# Patient Record
Sex: Male | Born: 1945 | Race: White | Hispanic: No | Marital: Married | Smoking: Never smoker
Health system: Southern US, Community
[De-identification: ages and names within clinical notes are randomized; demographics above are authoritative.]

## PROBLEM LIST (undated history)

## (undated) DIAGNOSIS — K760 Fatty (change of) liver, not elsewhere classified: Secondary | ICD-10-CM

## (undated) DIAGNOSIS — Z87442 Personal history of urinary calculi: Secondary | ICD-10-CM

## (undated) DIAGNOSIS — I10 Essential (primary) hypertension: Secondary | ICD-10-CM

## (undated) DIAGNOSIS — E785 Hyperlipidemia, unspecified: Secondary | ICD-10-CM

## (undated) DIAGNOSIS — N529 Male erectile dysfunction, unspecified: Secondary | ICD-10-CM

## (undated) DIAGNOSIS — D126 Benign neoplasm of colon, unspecified: Secondary | ICD-10-CM

## (undated) DIAGNOSIS — C61 Malignant neoplasm of prostate: Secondary | ICD-10-CM

## (undated) DIAGNOSIS — E119 Type 2 diabetes mellitus without complications: Secondary | ICD-10-CM

## (undated) DIAGNOSIS — M5430 Sciatica, unspecified side: Secondary | ICD-10-CM

## (undated) DIAGNOSIS — M5412 Radiculopathy, cervical region: Secondary | ICD-10-CM

## (undated) DIAGNOSIS — M5090 Cervical disc disorder, unspecified, unspecified cervical region: Secondary | ICD-10-CM

## (undated) DIAGNOSIS — I6529 Occlusion and stenosis of unspecified carotid artery: Secondary | ICD-10-CM

## (undated) DIAGNOSIS — S329XXA Fracture of unspecified parts of lumbosacral spine and pelvis, initial encounter for closed fracture: Secondary | ICD-10-CM

## (undated) HISTORY — DX: Hyperlipidemia, unspecified: E78.5

## (undated) HISTORY — DX: Male erectile dysfunction, unspecified: N52.9

## (undated) HISTORY — DX: Fracture of unspecified parts of lumbosacral spine and pelvis, initial encounter for closed fracture: S32.9XXA

## (undated) HISTORY — DX: Cervical disc disorder, unspecified, unspecified cervical region: M50.90

## (undated) HISTORY — DX: Fatty (change of) liver, not elsewhere classified: K76.0

## (undated) HISTORY — PX: HERNIA REPAIR: SHX51

## (undated) HISTORY — PX: PROSTATE BIOPSY: SHX241

## (undated) HISTORY — DX: Essential (primary) hypertension: I10

## (undated) HISTORY — DX: Benign neoplasm of colon, unspecified: D12.6

## (undated) HISTORY — DX: Occlusion and stenosis of unspecified carotid artery: I65.29

## (undated) HISTORY — DX: Radiculopathy, cervical region: M54.12

## (undated) HISTORY — DX: Sciatica, unspecified side: M54.30

---
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
## 1988-02-14 HISTORY — PX: INGUINAL HERNIA REPAIR: SUR1180

## 2003-02-14 DIAGNOSIS — S329XXA Fracture of unspecified parts of lumbosacral spine and pelvis, initial encounter for closed fracture: Secondary | ICD-10-CM

## 2003-02-14 HISTORY — DX: Fracture of unspecified parts of lumbosacral spine and pelvis, initial encounter for closed fracture: S32.9XXA

## 2010-02-13 HISTORY — PX: OTHER SURGICAL HISTORY: SHX169

## 2011-03-01 DIAGNOSIS — Z23 Encounter for immunization: Secondary | ICD-10-CM | POA: Diagnosis not present

## 2011-07-08 ENCOUNTER — Encounter: Payer: Self-pay | Admitting: Internal Medicine

## 2011-07-08 DIAGNOSIS — Z0000 Encounter for general adult medical examination without abnormal findings: Secondary | ICD-10-CM | POA: Insufficient documentation

## 2011-07-11 ENCOUNTER — Telehealth: Payer: Self-pay

## 2011-07-11 NOTE — Telephone Encounter (Signed)
Called the patient left message to call back 

## 2011-07-11 NOTE — Telephone Encounter (Signed)
Message copied by Pincus Sanes on Tue Jul 11, 2011  3:13 PM ------      Message from: Corwin Levins      Created: Sat Jul 08, 2011  5:15 PM      Regarding: get records before visit?       This is  A 65yo new pt for June 3            Can we contact him to get any records he may have locally or even not locally?  We may need to have a signed HIPPA or release form, I dont know.  If he is [place] from out of [place] though, I think we can wait until his appt to look into getting records

## 2011-07-12 NOTE — Telephone Encounter (Signed)
Called the patient left message to call back 

## 2011-07-17 ENCOUNTER — Encounter: Payer: Self-pay | Admitting: Internal Medicine

## 2011-07-17 ENCOUNTER — Ambulatory Visit (INDEPENDENT_AMBULATORY_CARE_PROVIDER_SITE_OTHER): Payer: Medicare Other | Admitting: Internal Medicine

## 2011-07-17 ENCOUNTER — Other Ambulatory Visit (INDEPENDENT_AMBULATORY_CARE_PROVIDER_SITE_OTHER): Payer: Medicare Other

## 2011-07-17 VITALS — BP 132/72 | HR 65 | Temp 98.3°F | Ht 71.0 in | Wt 167.8 lb

## 2011-07-17 DIAGNOSIS — N320 Bladder-neck obstruction: Secondary | ICD-10-CM

## 2011-07-17 DIAGNOSIS — N529 Male erectile dysfunction, unspecified: Secondary | ICD-10-CM | POA: Diagnosis not present

## 2011-07-17 DIAGNOSIS — E785 Hyperlipidemia, unspecified: Secondary | ICD-10-CM | POA: Diagnosis not present

## 2011-07-17 DIAGNOSIS — I10 Essential (primary) hypertension: Secondary | ICD-10-CM

## 2011-07-17 DIAGNOSIS — I6529 Occlusion and stenosis of unspecified carotid artery: Secondary | ICD-10-CM | POA: Insufficient documentation

## 2011-07-17 DIAGNOSIS — M5412 Radiculopathy, cervical region: Secondary | ICD-10-CM | POA: Insufficient documentation

## 2011-07-17 DIAGNOSIS — M5430 Sciatica, unspecified side: Secondary | ICD-10-CM

## 2011-07-17 DIAGNOSIS — IMO0001 Reserved for inherently not codable concepts without codable children: Secondary | ICD-10-CM

## 2011-07-17 DIAGNOSIS — M5090 Cervical disc disorder, unspecified, unspecified cervical region: Secondary | ICD-10-CM | POA: Insufficient documentation

## 2011-07-17 DIAGNOSIS — E1140 Type 2 diabetes mellitus with diabetic neuropathy, unspecified: Secondary | ICD-10-CM | POA: Insufficient documentation

## 2011-07-17 DIAGNOSIS — Z0000 Encounter for general adult medical examination without abnormal findings: Secondary | ICD-10-CM

## 2011-07-17 HISTORY — DX: Sciatica, unspecified side: M54.30

## 2011-07-17 LAB — URINALYSIS, ROUTINE W REFLEX MICROSCOPIC
Ketones, ur: NEGATIVE
Specific Gravity, Urine: >=1.03 (ref 1.000–1.030)
Urine Glucose: 100
Urobilinogen, UA: 0.2 (ref 0.0–1.0)
pH: 5.5 (ref 5.0–8.0)

## 2011-07-17 LAB — HEPATIC FUNCTION PANEL
Albumin: 3.9 g/dL (ref 3.5–5.2)
Alkaline Phosphatase: 51 U/L (ref 39–117)
Total Protein: 7.2 g/dL (ref 6.0–8.3)

## 2011-07-17 LAB — CBC WITH DIFFERENTIAL/PLATELET
Basophils Relative: 1.1 % (ref 0.0–3.0)
Eosinophils Relative: 2.1 % (ref 0.0–5.0)
HCT: 41.6 % (ref 39.0–52.0)
Hemoglobin: 13.9 g/dL (ref 13.0–17.0)
Lymphocytes Relative: 29.9 % (ref 12.0–46.0)
Lymphs Abs: 2.8 10*3/uL (ref 0.7–4.0)
Monocytes Relative: 4.2 % (ref 3.0–12.0)
Neutro Abs: 5.8 10*3/uL (ref 1.4–7.7)
RBC: 4.34 Mil/uL (ref 4.22–5.81)
WBC: 9.2 10*3/uL (ref 4.5–10.5)

## 2011-07-17 LAB — BASIC METABOLIC PANEL
BUN: 19 mg/dL (ref 6–23)
Calcium: 9.2 mg/dL (ref 8.4–10.5)
Creatinine, Ser: 0.8 mg/dL (ref 0.4–1.5)
GFR: 105.2 mL/min (ref >60.00)
Glucose, Bld: 200 mg/dL — ABNORMAL HIGH (ref 70–99)
Sodium: 140 mEq/L (ref 135–145)

## 2011-07-17 LAB — MICROALBUMIN / CREATININE URINE RATIO: Microalb Creat Ratio: 0.4 mg/g (ref 0.0–30.0)

## 2011-07-17 LAB — LIPID PANEL
Cholesterol: 163 mg/dL (ref 0–200)
HDL: 40 mg/dL (ref >39.00)
Triglycerides: 283 mg/dL — ABNORMAL HIGH (ref 0.0–149.0)

## 2011-07-17 LAB — PSA: PSA: 1.71 ng/mL (ref 0.10–4.00)

## 2011-07-17 MED ORDER — LOSARTAN POTASSIUM 25 MG PO TABS: 25.0000 mg | ORAL_TABLET | Freq: Every day | ORAL | Status: DC

## 2011-07-17 MED ORDER — GLIPIZIDE ER 2.5 MG PO TB24: 2.5000 mg | ORAL_TABLET | Freq: Every day | ORAL | Status: DC

## 2011-07-17 MED ORDER — ATORVASTATIN CALCIUM 10 MG PO TABS: 10.0000 mg | ORAL_TABLET | Freq: Every day | ORAL | Status: DC

## 2011-07-17 MED ORDER — VARDENAFIL HCL 20 MG PO TABS: 20.0000 mg | ORAL_TABLET | ORAL | Status: DC | PRN

## 2011-07-17 MED ORDER — SAXAGLIPTIN-METFORMIN ER 2.5-1000 MG PO TB24: 1.0000 | ORAL_TABLET | Freq: Two times a day (BID) | ORAL | Status: DC

## 2011-07-17 NOTE — Telephone Encounter (Signed)
Called the patient several times and have not been able to contact.  Left messages as  Well and no call back.  Patients appointment is today July 17, 2011.

## 2011-07-17 NOTE — Patient Instructions (Addendum)
Take all new medications as prescribed - the levitra Ok to stop the cialis You will be contacted regarding the referral for: colonoscopy Please go to LAB in the Basement for the blood and/or urine tests to be done today You will be contacted by phone if any changes need to be made immediately.  Otherwise, you will receive a letter about your results with an explanation. Take all new medications as prescribed - the low dose losartan 25 mg Continue all other medications as before Your refills were done today Please have the pharmacy call with any refills you may need. Please return in 6 months, or sooner if needed

## 2011-07-17 NOTE — Assessment & Plan Note (Signed)
stable overall by hx and exam, most recent data reviewed with pt- oct 2012  - labs brought with him per pt, and pt to continue medical treatment as before, for lab today

## 2011-07-17 NOTE — Assessment & Plan Note (Signed)
Due for colonoscopy - will refer 

## 2011-07-17 NOTE — Progress Notes (Signed)
Subjective:    Patient ID: Vincent Kent, male    DOB: 08/08/1945, 66 y.o.   MRN: [id number]  HPI  Here to establish as new pt;  Overall doing ok;  Pt denies CP, worsening SOB, DOE, wheezing, orthopnea, PND, worsening LE edema, palpitations, dizziness or syncope.  Pt denies neurological change such as new Headache, facial or extremity weakness.  Pt denies polydipsia, polyuria, or low sugar symptoms. Pt states overall good compliance with treatment and medications, good tolerability, and trying to follow lower cholesterol diet.  Pt denies worsening depressive symptoms, suicidal ideation or panic. No fever, wt loss, night sweats, loss of appetite, or other constitutional symptoms.  Pt states good ability with ADL's, low fall risk, home safety reviewed and adequate, no significant changes in hearing or vision, and occasionally active with exercise.  Has been on lisinopril in the past, BP has been up and down, has BP cuff at home, no side effects from the ACE but at one point had achilles tendonitiis, pt was afraid of gout so stopped the lisinopril,  BP has been 125/70, but ok with re-starting if need be.   Past Medical History  Diagnosis Date  . Diabetes mellitus   . Hyperlipidemia   . Hypertension   . Cervical radiculopathy   . Cervical disc disease   . Erectile dysfunction   . Carotid stenosis   . Sciatica 07/17/2011   Past Surgical History  Procedure Date  . Hernia repair 1990  . Tonsillectomy 1950  . Bone spur 2012    cervical bone spur removal  . Broken pelvis 2005    reports that he has never smoked. He has never used smokeless tobacco. He reports that he drinks alcohol. He reports that he does not use illicit drugs. family history includes Heart disease in his father and mother and Stroke in his mother. Allergies  Allergen Reactions  . Viagra (Sildenafil Citrate) Other (See Comments)    headache   Current Outpatient Prescriptions on File Prior to Visit  Medication Sig  Dispense Refill  . atorvastatin (LIPITOR) 10 MG tablet Take 1 tablet (10 mg total) by mouth daily.  90 tablet  3  . glipiZIDE (GLUCOTROL XL) 2.5 MG 24 hr tablet Take 1 tablet (2.5 mg total) by mouth daily.  90 tablet  3  . Saxagliptin-Metformin (KOMBIGLYZE XR) 2.06-998 MG TB24 Take 1 tablet by mouth 2 (two) times daily.  180 tablet  3  . losartan (COZAAR) 25 MG tablet Take 1 tablet (25 mg total) by mouth daily.  90 tablet  3  . vardenafil (LEVITRA) 20 MG tablet Take 1 tablet (20 mg total) by mouth as needed for erectile dysfunction.  5 tablet  11   Review of Systems Review of Systems  Constitutional: Negative for diaphoresis and unexpected weight change.  HENT: Negative for drooling and tinnitus.   Eyes: Negative for photophobia and visual disturbance.  Respiratory: Negative for choking and stridor.   Gastrointestinal: Negative for vomiting and blood in stool.  Genitourinary: Negative for hematuria and decreased urine volume.  Musculoskeletal: Negative for gait problem.  Skin: Negative for color change and wound.  Neurological: Negative for tremors and numbness.  Psychiatric/Behavioral: Negative for decreased concentration. The patient is not hyperactive.      Objective:   Physical Exam BP 132/72  Pulse 65  Temp(Src) 98.3 F (36.8 C) (Oral)  Ht 5\' 11"  (1.803 m)  Wt 167 lb 12 oz (76.091 kg)  BMI 23.40 kg/m2  SpO2 96% Physical Exam  VS noted Constitutional: Pt appears well-developed and well-nourished.  HENT: Head: Normocephalic.  Right Ear: External ear normal.  Left Ear: External ear normal.  Eyes: Conjunctivae and EOM are normal. Pupils are equal, round, and reactive to light.  Neck: Normal range of motion. Neck supple.  Cardiovascular: Normal rate and regular rhythm.   Pulmonary/Chest: Effort normal and breath sounds normal.  Abd:  Soft, NT, non-distended, + BS Neurological: Pt is alert. No cranial nerve deficit.  Motor/dtr/gait intact  Skin: Skin is warm. No erythema. No  LE edema Psychiatric: Pt behavior is normal. Thought content normal. mild nervous    Assessment & Plan:

## 2011-07-17 NOTE — Assessment & Plan Note (Signed)
Also due for psa - to be ordered 

## 2011-07-17 NOTE — Assessment & Plan Note (Signed)
Mild worsening in the past 6 mo - for testosterone check, ok to try change cialis to levitra as needed

## 2011-07-17 NOTE — Assessment & Plan Note (Signed)
stable overall by hx and exam, most recent data reviewed with pt, and pt to continue medical treatment as before BP Readings from Last 3 Encounters:  07/17/11 132/72

## 2011-07-17 NOTE — Assessment & Plan Note (Signed)
stable overall by hx and exam, most recent data reviewed with pt- oct 2012  - labs brought with him per pt, and pt to continue medical treatment as before, for lab today  

## 2011-07-18 ENCOUNTER — Other Ambulatory Visit: Payer: Self-pay | Admitting: Internal Medicine

## 2011-07-18 ENCOUNTER — Encounter: Payer: Self-pay | Admitting: Internal Medicine

## 2011-07-18 DIAGNOSIS — R3129 Other microscopic hematuria: Secondary | ICD-10-CM

## 2011-07-18 LAB — TESTOSTERONE, FREE, TOTAL, SHBG: Testosterone, Free: 63.5 pg/mL (ref 47.0–244.0)

## 2011-07-26 ENCOUNTER — Encounter: Payer: Self-pay | Admitting: Gastroenterology

## 2011-08-23 DIAGNOSIS — R3129 Other microscopic hematuria: Secondary | ICD-10-CM | POA: Diagnosis not present

## 2011-09-13 ENCOUNTER — Ambulatory Visit (AMBULATORY_SURGERY_CENTER): Payer: Medicare Other

## 2011-09-13 VITALS — Ht 70.0 in | Wt 165.5 lb

## 2011-09-13 DIAGNOSIS — Z1211 Encounter for screening for malignant neoplasm of colon: Secondary | ICD-10-CM

## 2011-09-13 MED ORDER — MOVIPREP 100 G PO SOLR: 1.0000 | Freq: Once | ORAL | Status: DC

## 2011-09-27 ENCOUNTER — Encounter: Payer: Self-pay | Admitting: Gastroenterology

## 2011-09-27 ENCOUNTER — Ambulatory Visit (AMBULATORY_SURGERY_CENTER): Payer: Medicare Other | Admitting: Gastroenterology

## 2011-09-27 VITALS — BP 150/87 | HR 77 | Temp 97.7°F | Resp 10 | Ht 70.0 in | Wt 165.0 lb

## 2011-09-27 DIAGNOSIS — Z1211 Encounter for screening for malignant neoplasm of colon: Secondary | ICD-10-CM

## 2011-09-27 DIAGNOSIS — D126 Benign neoplasm of colon, unspecified: Secondary | ICD-10-CM | POA: Diagnosis not present

## 2011-09-27 DIAGNOSIS — K5730 Diverticulosis of large intestine without perforation or abscess without bleeding: Secondary | ICD-10-CM | POA: Diagnosis not present

## 2011-09-27 MED ORDER — SODIUM CHLORIDE 0.9 % IV SOLN: 500.0000 mL | INTRAVENOUS | Status: DC

## 2011-09-27 NOTE — Patient Instructions (Addendum)

## 2011-09-27 NOTE — Progress Notes (Signed)
Abdominal pressure by the tech to aid the scope advancement to reach the cecum. Maw

## 2011-09-27 NOTE — Progress Notes (Signed)
Patient did not experience any of the following events: a burn prior to discharge; a fall within the facility; wrong site/side/patient/procedure/implant event; or a hospital transfer or hospital admission upon discharge from the facility. (G8907) Patient did not have preoperative order for IV antibiotic SSI prophylaxis. (G8918)  

## 2011-09-27 NOTE — Progress Notes (Signed)
The pt tolerated the colonoscopy very well. Maw   

## 2011-09-27 NOTE — Op Note (Signed)
[facility] [address] [place], [place]  [address]  COLONOSCOPY PROCEDURE REPORT  PATIENT:  Vincent Kent, Vincent Kent  MR#:  [id number] BIRTHDATE:  02-Jul-1945, 66 yrs. old  GENDER:  male ENDOSCOPIST:  Vania Rea. Jarold Motto, MD, [facility] REF. BY:  Oliver Barre, M.D. PROCEDURE DATE:  09/27/2011 PROCEDURE:  Colonoscopy with snare polypectomy ASA CLASS:  Class II INDICATIONS:  Colorectal cancer screening, average risk MEDICATIONS:   propofol (Diprivan) 200 mg IV  DESCRIPTION OF PROCEDURE:   After the risks and benefits and of the procedure were explained, informed consent was obtained. Digital rectal exam was performed and revealed no abnormalities. The LB [id number] endoscope was introduced through the anus and advanced to the cecum, which was identified by both the appendix and ileocecal valve.  The quality of the prep was poor, using MoviPrep.  The instrument was then slowly withdrawn as the colon was fully examined. <<PROCEDUREIMAGES>>  FINDINGS:  There were mild diverticular changes in left colon. diverticulosis was found.  A sessile polyp was found in the sigmoid colon. 3mm flat polyp cold snare excised.   Retroflexed views in the rectum revealed no abnormalities.    The scope was then withdrawn from the patient and the procedure completed.  COMPLICATIONS:  None ENDOSCOPIC IMPRESSION: 1) Diverticulosis,mild,left sided diverticulosis 2) Sessile polyp in the sigmoid colon R/O ADENOMA RECOMMENDATIONS: 1) Await pathology results 2) Repeat colonoscopy in 5 years if polyp adenomatous; otherwise 10 years 3) High fiber diet.  REPEAT EXAM:  No  ______________________________ Vania Rea. Jarold Motto, MD, [facility]  CC:  n. eSIGNED:   Vania Rea. Patterson at 09/27/2011 09:07 AM  Kirt Boys, [phone]

## 2011-09-28 ENCOUNTER — Telehealth: Payer: Self-pay

## 2011-09-28 NOTE — Telephone Encounter (Signed)
Left message on answering machine. 

## 2011-10-02 ENCOUNTER — Encounter: Payer: Self-pay | Admitting: Gastroenterology

## 2011-10-24 ENCOUNTER — Telehealth: Payer: Self-pay | Admitting: Internal Medicine

## 2011-10-24 NOTE — Telephone Encounter (Signed)
Pt is sch to see Dr Jonny Ruiz on 9/13, having trouble with managing bloodsugars. Does he need A1C before that appt?

## 2011-10-24 NOTE — Telephone Encounter (Signed)
unfort unable due to his insurance  Please bring record of sugars, and will see at his OV

## 2011-10-24 NOTE — Telephone Encounter (Signed)
Called left message to call back 

## 2011-10-25 NOTE — Telephone Encounter (Signed)
PT IS AWARE TO BRING BS RECORD AND DO LABS AT APPT.

## 2011-10-27 ENCOUNTER — Encounter: Payer: Self-pay | Admitting: Internal Medicine

## 2011-10-27 ENCOUNTER — Other Ambulatory Visit (INDEPENDENT_AMBULATORY_CARE_PROVIDER_SITE_OTHER): Payer: Medicare Other

## 2011-10-27 ENCOUNTER — Ambulatory Visit (INDEPENDENT_AMBULATORY_CARE_PROVIDER_SITE_OTHER): Payer: Medicare Other | Admitting: Internal Medicine

## 2011-10-27 VITALS — BP 128/70 | HR 69 | Temp 98.2°F | Ht 70.0 in | Wt 166.1 lb

## 2011-10-27 DIAGNOSIS — E785 Hyperlipidemia, unspecified: Secondary | ICD-10-CM

## 2011-10-27 DIAGNOSIS — D126 Benign neoplasm of colon, unspecified: Secondary | ICD-10-CM

## 2011-10-27 DIAGNOSIS — I10 Essential (primary) hypertension: Secondary | ICD-10-CM

## 2011-10-27 HISTORY — DX: Benign neoplasm of colon, unspecified: D12.6

## 2011-10-27 LAB — LIPID PANEL
Cholesterol: 134 mg/dL (ref 0–200)
Triglycerides: 192 mg/dL — ABNORMAL HIGH (ref 0.0–149.0)
VLDL: 38.4 mg/dL (ref 0.0–40.0)

## 2011-10-27 LAB — HEPATIC FUNCTION PANEL
ALT: 22 U/L (ref 0–53)
AST: 29 U/L (ref 0–37)
Albumin: 4.1 g/dL (ref 3.5–5.2)

## 2011-10-27 LAB — HEMOGLOBIN A1C: Hgb A1c MFr Bld: 6 % (ref 4.6–6.5)

## 2011-10-27 LAB — BASIC METABOLIC PANEL
BUN: 22 mg/dL (ref 6–23)
Chloride: 107 mEq/L (ref 96–112)
Glucose, Bld: 85 mg/dL (ref 70–99)
Potassium: 4.9 mEq/L (ref 3.5–5.1)

## 2011-10-27 MED ORDER — ATORVASTATIN CALCIUM 10 MG PO TABS: 10.0000 mg | ORAL_TABLET | Freq: Every day | ORAL | Status: DC

## 2011-10-27 MED ORDER — SITAGLIPTIN PHOS-METFORMIN HCL 50-1000 MG PO TABS: 1.0000 | ORAL_TABLET | Freq: Two times a day (BID) | ORAL | Status: DC

## 2011-10-27 MED ORDER — TADALAFIL 20 MG PO TABS: 20.0000 mg | ORAL_TABLET | Freq: Every day | ORAL | Status: DC | PRN

## 2011-10-27 MED ORDER — LOSARTAN POTASSIUM 25 MG PO TABS: 25.0000 mg | ORAL_TABLET | Freq: Every day | ORAL | Status: DC

## 2011-10-27 MED ORDER — GLUCOSE BLOOD VI STRP: ORAL_STRIP | Status: DC

## 2011-10-27 NOTE — Patient Instructions (Addendum)
Ok to stop the glipizide as it does not seem to be helping OK to stop the Kombiglyze Please start the Janumet 50/1000 samples at 1 pill twice per day You are given the hardcopy of your prescriptions as requested today Please go to LAB in the Basement for the blood and/or urine tests to be done today You will be contacted by phone if any changes need to be made immediately.  Otherwise, you will receive a letter about your results with an explanation Please continue to check your blood sugars once per day, but at varying times of the day, either before one of the meals or before bedtime Please call before 3 months if your sugars are consistenly more than 175-180, as we would consider starting low dose actos even before the next visit Please return in approximately 3 months, or sooner if needed

## 2011-10-28 ENCOUNTER — Encounter: Payer: Self-pay | Admitting: Internal Medicine

## 2011-10-28 NOTE — Assessment & Plan Note (Signed)
stable overall by hx and exam, most recent data reviewed with pt, and pt to continue medical treatment as before BP Readings from Last 3 Encounters:  10/27/11 128/70  09/27/11 150/87  07/17/11 132/72

## 2011-10-28 NOTE — Assessment & Plan Note (Signed)
stable overall by hx and exam, most recent data reviewed with pt, and pt to continue medical treatment as before Lab Results  Component Value Date   LDLCALC 54 10/27/2011

## 2011-10-28 NOTE — Progress Notes (Signed)
Subjective:    Patient ID: Vincent Kent, male    DOB: 11-04-1945, 66 y.o.   MRN: [id number]  HPI  Here to f/u; Here to f/u; overall doing ok,  Pt denies chest pain, increased sob or doe, wheezing, orthopnea, PND, increased LE swelling, palpitations, dizziness or syncope.  Pt denies new neurological symptoms such as new headache, or facial or extremity weakness or numbness   Pt denies polydipsia, polyuria, or low sugar symptoms such as weakness or confusion improved with po intake.  Pt states overall good compliance with meds, trying to follow lower cholesterol, diabetic diet, wt overall stable but little exercise however. CBG's have been slightly elevated about 20 -30 pts in the past 2-3 wks.  Pt denies fever, wt loss, night sweats, loss of appetite, or other constitutional symptoms.  No improvement with taking double his glucotrol Past Medical History  Diagnosis Date  . Diabetes mellitus   . Hyperlipidemia   . Hypertension   . Cervical radiculopathy   . Cervical disc disease   . Erectile dysfunction   . Carotid stenosis   . Sciatica 07/17/2011  . Polyp of colon, adenomatous 10/27/2011    August 2013 - for f/u colonoscopy at 3 yrs   Past Surgical History  Procedure Date  . Hernia repair 1990  . Tonsillectomy 1950  . Bone spur 2012    cervical bone spur removal  . Broken pelvis 2005    reports that he has never smoked. He has never used smokeless tobacco. He reports that he drinks alcohol. He reports that he does not use illicit drugs. family history includes Heart disease in his father and mother and Stroke in his mother.  There is no history of Colon cancer. Allergies  Allergen Reactions  . Viagra (Sildenafil Citrate) Other (See Comments)    headache   Current Outpatient Prescriptions on File Prior to Visit  Medication Sig Dispense Refill  . aspirin 81 MG tablet Take 81 mg by mouth daily.      Marland Kitchen atorvastatin (LIPITOR) 10 MG tablet Take 1 tablet (10 mg total) by mouth daily.   90 tablet  3  . losartan (COZAAR) 25 MG tablet Take 1 tablet (25 mg total) by mouth daily.  90 tablet  3  . Omega-3 Fatty Acids (OMEGA 3 PO) Take by mouth once.      . sitaGLIPtan-metformin (JANUMET) 50-1000 MG per tablet Take 1 tablet by mouth 2 (two) times daily with a meal.  180 tablet  3  . vardenafil (LEVITRA) 20 MG tablet Take 1 tablet (20 mg total) by mouth as needed for erectile dysfunction.  5 tablet  11   Review of Systems  Constitutional: Negative for diaphoresis and unexpected weight change.  HENT: Negative for tinnitus.   Eyes: Negative for photophobia and visual disturbance.  Respiratory: Negative for choking and stridor.   Gastrointestinal: Negative for vomiting and blood in stool.  Genitourinary: Negative for hematuria and decreased urine volume.  Musculoskeletal: Negative for gait problem.  Skin: Negative for color change and wound.  Neurological: Negative for tremors and numbness.  Psychiatric/Behavioral: Negative for decreased concentration. The patient is not hyperactive.       Objective:   Physical Exam BP 128/70  Pulse 69  Temp 98.2 F (36.8 C) (Oral)  Ht 5\' 10"  (1.778 m)  Wt 166 lb 2 oz (75.354 kg)  BMI 23.84 kg/m2  SpO2 97% Physical Exam  VS noted Constitutional: Pt appears well-developed and well-nourished.  HENT: Head: Normocephalic.  Right Ear:  External ear normal.  Left Ear: External ear normal.  Eyes: Conjunctivae and EOM are normal. Pupils are equal, round, and reactive to light.  Neck: Normal range of motion. Neck supple.  Cardiovascular: Normal rate and regular rhythm.   Pulmonary/Chest: Effort normal and breath sounds normal.  Neurological: Pt is alert. Not confused  Skin: Skin is warm. No erythema.  Psychiatric: Pt behavior is normal. Thought content normal.     Assessment & Plan:

## 2011-10-28 NOTE — Assessment & Plan Note (Signed)
stable overall by hx and exam except mild increased cbg recent, most recent data reviewed with pt, and pt to continue medical treatment as before except to d/c the glucotrol as does not seem to work further, and change komblyglyze to janumet which may be more effective, consider add actos if cont's less controlled and a1c > 7

## 2011-11-16 ENCOUNTER — Other Ambulatory Visit: Payer: Self-pay

## 2011-11-16 MED ORDER — ATORVASTATIN CALCIUM 10 MG PO TABS: 10.0000 mg | ORAL_TABLET | Freq: Every day | ORAL | Status: DC

## 2011-11-16 MED ORDER — GLUCOSE BLOOD VI STRP: ORAL_STRIP | Status: DC

## 2011-11-16 MED ORDER — SITAGLIPTIN PHOS-METFORMIN HCL 50-1000 MG PO TABS: 1.0000 | ORAL_TABLET | Freq: Two times a day (BID) | ORAL | Status: DC

## 2011-11-16 MED ORDER — LOSARTAN POTASSIUM 25 MG PO TABS: 25.0000 mg | ORAL_TABLET | Freq: Every day | ORAL | Status: DC

## 2011-11-16 MED ORDER — TADALAFIL 20 MG PO TABS: 20.0000 mg | ORAL_TABLET | Freq: Every day | ORAL | Status: DC | PRN

## 2011-11-20 DIAGNOSIS — R3129 Other microscopic hematuria: Secondary | ICD-10-CM | POA: Diagnosis not present

## 2011-11-22 ENCOUNTER — Telehealth: Payer: Self-pay | Admitting: Internal Medicine

## 2011-11-22 DIAGNOSIS — K869 Disease of pancreas, unspecified: Secondary | ICD-10-CM

## 2011-11-22 NOTE — Telephone Encounter (Signed)
I recevied fax from urology regarding abormal CT suggestive of pancreatic lesions describes as prob cyst but warranting further evaluation  Per radiology recommendation - will order MRI abd with and without contrast  I think his BMET from sept 13 is ok for renal insuff screen prior to contrast; no need other BMET prior to MRI  Robin to let pt know this was ordered; he should be contacted by [facility] soon

## 2011-11-22 NOTE — Telephone Encounter (Signed)
Called left message to call back 

## 2011-11-23 NOTE — Telephone Encounter (Signed)
Patient informed. 

## 2011-11-29 ENCOUNTER — Telehealth: Payer: Self-pay

## 2011-11-29 NOTE — Telephone Encounter (Signed)
Message copied by Pincus Sanes on Wed Nov 29, 2011  4:58 PM ------      Message from: Corwin Levins      Created: Wed Nov 29, 2011 12:27 PM      Regarding: needs MRI       To PCC's to remind me how to do this since it is not intuitive on EPIC      ----- Message -----         From: Vladimir Crofts Ewing         Sent: 11/29/2011  10:10 AM           To: Corwin Levins, MD            [facility] called to inform his MRI abd. Be reordered with and wtihout contrast.

## 2011-11-30 ENCOUNTER — Other Ambulatory Visit: Payer: Self-pay | Admitting: Internal Medicine

## 2011-11-30 DIAGNOSIS — K869 Disease of pancreas, unspecified: Secondary | ICD-10-CM

## 2011-12-11 ENCOUNTER — Encounter: Payer: Self-pay | Admitting: Internal Medicine

## 2011-12-11 ENCOUNTER — Other Ambulatory Visit: Payer: Self-pay | Admitting: Internal Medicine

## 2011-12-11 ENCOUNTER — Ambulatory Visit
Admission: RE | Admit: 2011-12-11 | Discharge: 2011-12-11 | Disposition: A | Discharge status: Home / Self Care | Payer: Medicare Other | Source: Ambulatory Visit | Attending: Internal Medicine | Admitting: Internal Medicine

## 2011-12-11 DIAGNOSIS — K869 Disease of pancreas, unspecified: Secondary | ICD-10-CM

## 2011-12-11 DIAGNOSIS — K7689 Other specified diseases of liver: Secondary | ICD-10-CM | POA: Diagnosis not present

## 2011-12-11 DIAGNOSIS — K862 Cyst of pancreas: Secondary | ICD-10-CM | POA: Diagnosis not present

## 2011-12-11 DIAGNOSIS — R932 Abnormal findings on diagnostic imaging of liver and biliary tract: Secondary | ICD-10-CM

## 2011-12-11 DIAGNOSIS — K863 Pseudocyst of pancreas: Secondary | ICD-10-CM | POA: Diagnosis not present

## 2011-12-11 MED ORDER — GADOBENATE DIMEGLUMINE 529 MG/ML IV SOLN
15.0000 mL | Freq: Once | INTRAVENOUS | Status: AC | PRN
  Administered 2011-12-11: 15 mL via INTRAVENOUS

## 2012-01-15 ENCOUNTER — Ambulatory Visit: Payer: Medicare Other | Admitting: Internal Medicine

## 2012-01-22 DIAGNOSIS — E119 Type 2 diabetes mellitus without complications: Secondary | ICD-10-CM | POA: Diagnosis not present

## 2012-01-22 DIAGNOSIS — H43819 Vitreous degeneration, unspecified eye: Secondary | ICD-10-CM | POA: Diagnosis not present

## 2012-01-22 DIAGNOSIS — H2510 Age-related nuclear cataract, unspecified eye: Secondary | ICD-10-CM | POA: Diagnosis not present

## 2012-01-22 DIAGNOSIS — H01009 Unspecified blepharitis unspecified eye, unspecified eyelid: Secondary | ICD-10-CM | POA: Diagnosis not present

## 2012-01-24 ENCOUNTER — Telehealth: Payer: Self-pay | Admitting: *Deleted

## 2012-01-24 NOTE — Telephone Encounter (Signed)
Pt has OV on Friday and wants to know if he needs to come in early for labs before appointment.

## 2012-01-24 NOTE — Telephone Encounter (Signed)
Called left message to call back 

## 2012-01-24 NOTE — Telephone Encounter (Signed)
Unfortunately we cannot order labs prior as his primary insurance appears to be medicare, and medicare will not pay

## 2012-01-25 NOTE — Telephone Encounter (Signed)
Patient informed. 

## 2012-01-26 ENCOUNTER — Encounter: Payer: Self-pay | Admitting: Internal Medicine

## 2012-01-26 ENCOUNTER — Ambulatory Visit (INDEPENDENT_AMBULATORY_CARE_PROVIDER_SITE_OTHER): Payer: Medicare Other | Admitting: Internal Medicine

## 2012-01-26 VITALS — BP 120/70 | HR 66 | Temp 97.7°F | Ht 70.0 in | Wt 167.0 lb

## 2012-01-26 DIAGNOSIS — E785 Hyperlipidemia, unspecified: Secondary | ICD-10-CM | POA: Diagnosis not present

## 2012-01-26 DIAGNOSIS — I10 Essential (primary) hypertension: Secondary | ICD-10-CM

## 2012-01-26 MED ORDER — SITAGLIPTIN PHOS-METFORMIN HCL 50-1000 MG PO TABS: 1.0000 | ORAL_TABLET | Freq: Two times a day (BID) | ORAL | Status: DC

## 2012-01-26 MED ORDER — GLUCOSE BLOOD VI STRP: ORAL_STRIP | Status: DC

## 2012-01-26 MED ORDER — GLIPIZIDE ER 5 MG PO TB24: 5.0000 mg | ORAL_TABLET | Freq: Every day | ORAL | Status: DC

## 2012-01-26 MED ORDER — LOSARTAN POTASSIUM 50 MG PO TABS: 50.0000 mg | ORAL_TABLET | Freq: Every day | ORAL | Status: DC

## 2012-01-26 MED ORDER — ATORVASTATIN CALCIUM 20 MG PO TABS: 20.0000 mg | ORAL_TABLET | Freq: Every day | ORAL | Status: DC

## 2012-01-26 NOTE — Patient Instructions (Addendum)
OK to take the prescriptions to be filled as prescribed, but remember only take HALF of the dosing to last through your [place] 6 mo working trip Please return in 8 mo with Lab testing done 3-5 days before

## 2012-01-27 ENCOUNTER — Encounter: Payer: Self-pay | Admitting: Internal Medicine

## 2012-01-27 NOTE — Assessment & Plan Note (Signed)
stable overall by hx and exam, most recent data reviewed with pt, and pt to continue medical treatment as before Lab Results  Component Value Date   LDLCALC 54 10/27/2011   For f/u labs next visit

## 2012-01-27 NOTE — Progress Notes (Signed)
Subjective:    Patient ID: Vincent Kent, male    DOB: 18-Aug-1945, 66 y.o.   MRN: [id number]  HPI  Here to f/u, Here to f/u; overall doing ok,  Pt denies chest pain, increased sob or doe, wheezing, orthopnea, PND, increased LE swelling, palpitations, dizziness or syncope.  Pt denies new neurological symptoms such as new headache, or facial or extremity weakness or numbness   Pt denies polydipsia, polyuria, or low sugar symptoms such as weakness or confusion improved with po intake.  Pt states overall good compliance with meds, trying to follow lower cholesterol, diabetic diet, wt overall stable but little exercise however. Leaving for [place] soon however, going to be gone for 6 months, needs meds to last that long.    CBG's have been elevated off the glipizide, so went back to that, now AM cbg;s and afternoon average in low 100's. Past Medical History  Diagnosis Date  . Diabetes mellitus   . Hyperlipidemia   . Hypertension   . Cervical radiculopathy   . Cervical disc disease   . Erectile dysfunction   . Carotid stenosis   . Sciatica 07/17/2011  . Polyp of colon, adenomatous 10/27/2011    August 2013 - for f/u colonoscopy at 3 yrs   Past Surgical History  Procedure Date  . Hernia repair 1990  . Tonsillectomy 1950  . Bone spur 2012    cervical bone spur removal  . Broken pelvis 2005    reports that he has never smoked. He has never used smokeless tobacco. He reports that he drinks alcohol. He reports that he does not use illicit drugs. family history includes Heart disease in his father and mother and Stroke in his mother.  There is no history of Colon cancer. Allergies  Allergen Reactions  . Viagra (Sildenafil Citrate) Other (See Comments)    headache   Current Outpatient Prescriptions on File Prior to Visit  Medication Sig Dispense Refill  . aspirin 81 MG tablet Take 81 mg by mouth daily.      . Omega-3 Fatty Acids (OMEGA 3 PO) Take by mouth once.      . sitaGLIPtan-metformin  (JANUMET) 50-1000 MG per tablet Take 1 tablet by mouth 2 (two) times daily with a meal.  180 tablet  3  . tadalafil (CIALIS) 20 MG tablet Take 1 tablet (20 mg total) by mouth daily as needed for erectile dysfunction. 6 monthly  30 tablet  3  . vardenafil (LEVITRA) 20 MG tablet Take 1 tablet (20 mg total) by mouth as needed for erectile dysfunction.  5 tablet  11   Review of Systems  Constitutional: Negative for diaphoresis and unexpected weight change.  HENT: Negative for tinnitus.   Eyes: Negative for photophobia and visual disturbance.  Respiratory: Negative for choking and stridor.   Gastrointestinal: Negative for vomiting and blood in stool.  Genitourinary: Negative for hematuria and decreased urine volume.  Musculoskeletal: Negative for gait problem.  Skin: Negative for color change and wound.  Neurological: Negative for tremors and numbness.  Psychiatric/Behavioral: Negative for decreased concentration. The patient is not hyperactive.       Objective:   Physical Exam BP 120/70  Pulse 66  Temp 97.7 F (36.5 C) (Oral)  Ht 5\' 10"  (1.778 m)  Wt 167 lb (75.751 kg)  BMI 23.96 kg/m2  SpO2 97% Physical Exam  VS noted Constitutional: Pt appears well-developed and well-nourished.  HENT: Head: Normocephalic.  Right Ear: External ear normal.  Left Ear: External ear normal.  Eyes:  Conjunctivae and EOM are normal. Pupils are equal, round, and reactive to light.  Neck: Normal range of motion. Neck supple.  Cardiovascular: Normal rate and regular rhythm.   Pulmonary/Chest: Effort normal and breath sounds normal.  Abd:  Soft, NT, non-distended, + BS Neurological: Pt is alert. Not confused  Skin: Skin is warm. No erythema.  Psychiatric: Pt behavior is normal. Thought content normal.     Assessment & Plan:

## 2012-01-27 NOTE — Assessment & Plan Note (Signed)
stable overall by hx and exam, most recent data reviewed with pt, and pt to continue medical treatment as before BP Readings from Last 3 Encounters:  01/26/12 120/70  10/27/11 128/70  09/27/11 150/87

## 2012-01-27 NOTE — Assessment & Plan Note (Signed)
stable overall by hx and exam, most recent data reviewed with pt, and pt to continue medical treatment as before Lab Results  Component Value Date   HGBA1C 6.0 10/27/2011   rx adjusted to help with 6 mo supply to get through his 6 mo working trip in [place] starting in 2 wks

## 2012-02-01 ENCOUNTER — Other Ambulatory Visit: Payer: Self-pay | Admitting: Internal Medicine

## 2012-02-01 ENCOUNTER — Encounter: Payer: Self-pay | Admitting: Internal Medicine

## 2012-02-01 MED ORDER — TADALAFIL 20 MG PO TABS: 20.0000 mg | ORAL_TABLET | Freq: Every day | ORAL | Status: DC | PRN

## 2012-03-30 ENCOUNTER — Other Ambulatory Visit: Payer: Self-pay

## 2012-09-18 ENCOUNTER — Other Ambulatory Visit: Payer: Self-pay

## 2012-09-26 ENCOUNTER — Encounter: Payer: Self-pay | Admitting: Internal Medicine

## 2012-09-26 ENCOUNTER — Ambulatory Visit (INDEPENDENT_AMBULATORY_CARE_PROVIDER_SITE_OTHER): Payer: Medicare Other | Admitting: Internal Medicine

## 2012-09-26 ENCOUNTER — Other Ambulatory Visit (INDEPENDENT_AMBULATORY_CARE_PROVIDER_SITE_OTHER): Payer: Medicare Other

## 2012-09-26 VITALS — BP 118/78 | HR 66 | Temp 97.8°F | Ht 71.0 in | Wt 163.4 lb

## 2012-09-26 DIAGNOSIS — M79676 Pain in unspecified toe(s): Secondary | ICD-10-CM | POA: Insufficient documentation

## 2012-09-26 DIAGNOSIS — E785 Hyperlipidemia, unspecified: Secondary | ICD-10-CM

## 2012-09-26 DIAGNOSIS — Z136 Encounter for screening for cardiovascular disorders: Secondary | ICD-10-CM

## 2012-09-26 DIAGNOSIS — G609 Hereditary and idiopathic neuropathy, unspecified: Secondary | ICD-10-CM

## 2012-09-26 DIAGNOSIS — E1142 Type 2 diabetes mellitus with diabetic polyneuropathy: Secondary | ICD-10-CM | POA: Diagnosis not present

## 2012-09-26 DIAGNOSIS — E1149 Type 2 diabetes mellitus with other diabetic neurological complication: Secondary | ICD-10-CM

## 2012-09-26 DIAGNOSIS — G629 Polyneuropathy, unspecified: Secondary | ICD-10-CM | POA: Insufficient documentation

## 2012-09-26 DIAGNOSIS — Z0000 Encounter for general adult medical examination without abnormal findings: Secondary | ICD-10-CM

## 2012-09-26 DIAGNOSIS — M79675 Pain in left toe(s): Secondary | ICD-10-CM

## 2012-09-26 DIAGNOSIS — E1140 Type 2 diabetes mellitus with diabetic neuropathy, unspecified: Secondary | ICD-10-CM

## 2012-09-26 DIAGNOSIS — I10 Essential (primary) hypertension: Secondary | ICD-10-CM

## 2012-09-26 DIAGNOSIS — M79609 Pain in unspecified limb: Secondary | ICD-10-CM

## 2012-09-26 LAB — CBC WITH DIFFERENTIAL/PLATELET
Basophils Relative: 0.7 % (ref 0.0–3.0)
Eosinophils Absolute: 0.3 10*3/uL (ref 0.0–0.7)
Hemoglobin: 13.8 g/dL (ref 13.0–17.0)
MCHC: 33.6 g/dL (ref 30.0–36.0)
MCV: 95.8 fl (ref 78.0–100.0)
Monocytes Absolute: 0.5 10*3/uL (ref 0.1–1.0)
Neutro Abs: 4.4 10*3/uL (ref 1.4–7.7)
RBC: 4.28 Mil/uL (ref 4.22–5.81)

## 2012-09-26 LAB — LIPID PANEL
HDL: 43.2 mg/dL (ref >39.00)
LDL Cholesterol: 82 mg/dL (ref 0–99)
Total CHOL/HDL Ratio: 3
Triglycerides: 90 mg/dL (ref 0.0–149.0)
VLDL: 18 mg/dL (ref 0.0–40.0)

## 2012-09-26 LAB — BASIC METABOLIC PANEL
Calcium: 9.8 mg/dL (ref 8.4–10.5)
Creatinine, Ser: 1 mg/dL (ref 0.4–1.5)
GFR: 75.21 mL/min (ref >60.00)

## 2012-09-26 LAB — HEPATIC FUNCTION PANEL
Bilirubin, Direct: 0.1 mg/dL (ref 0.0–0.3)
Total Bilirubin: 0.6 mg/dL (ref 0.3–1.2)

## 2012-09-26 LAB — MICROALBUMIN / CREATININE URINE RATIO: Creatinine,U: 204.2 mg/dL

## 2012-09-26 MED ORDER — LOSARTAN POTASSIUM 50 MG PO TABS: 50.0000 mg | ORAL_TABLET | Freq: Every day | ORAL | Status: DC

## 2012-09-26 MED ORDER — SITAGLIPTIN PHOS-METFORMIN HCL 50-1000 MG PO TABS: 1.0000 | ORAL_TABLET | Freq: Two times a day (BID) | ORAL | Status: DC

## 2012-09-26 MED ORDER — TADALAFIL 20 MG PO TABS: 20.0000 mg | ORAL_TABLET | Freq: Every day | ORAL | Status: DC | PRN

## 2012-09-26 MED ORDER — ATORVASTATIN CALCIUM 20 MG PO TABS: 20.0000 mg | ORAL_TABLET | Freq: Every day | ORAL | Status: DC

## 2012-09-26 MED ORDER — GLUCOSE BLOOD VI STRP: ORAL_STRIP | Status: AC

## 2012-09-26 MED ORDER — GLUCOSE BLOOD VI STRP: ORAL_STRIP | Status: DC

## 2012-09-26 NOTE — Patient Instructions (Signed)
Please remember to have the PSA and UA done with your upcoming urology appt You will be contacted regarding the referral for: podiatry Please continue all other medications as before, and refills have been done if requested. Please have the pharmacy call with any other refills you may need. Please continue your efforts at being more active, low cholesterol diet, and weight control. You are otherwise up to date with prevention measures today. Please go to the LAB in the Basement (turn left off the elevator) for the tests to be done today You will be contacted by phone if any changes need to be made immediately.  Otherwise, you will receive a letter about your results with an explanation, but please check with MyChart first.  Please remember to sign up for My Chart if you have not done so, as this will be important to you in the future with finding out test results, communicating by private email, and scheduling acute appointments online when needed.  Please return in 6 months, or sooner if needed

## 2012-09-26 NOTE — Assessment & Plan Note (Signed)
stable overall by history and exam, recent data reviewed with pt, and pt to continue medical treatment as before,  to f/u any worsening symptoms or concerns Lab Results  Component Value Date   HGBA1C 6.0 10/27/2011

## 2012-09-26 NOTE — Assessment & Plan Note (Signed)
Not charged today but now up to date

## 2012-09-26 NOTE — Assessment & Plan Note (Addendum)
stable overall by history and exam, recent data reviewed with pt, and pt to continue medical treatment as before,  to f/u any worsening symptoms or concerns Lab Results  Component Value Date   LDLCALC 54 10/27/2011   Note:  Total time for pt hx, exam, review of record with pt in the room, determination of diagnoses and plan for further eval and tx is > 40 min, with over 50% spent in coordination and counseling of patient

## 2012-09-26 NOTE — Progress Notes (Signed)
Subjective:    Patient ID: Vincent Kent, male    DOB: 06-14-1945, 67 y.o.   MRN: [id number]  HPI  Here for yearly f/u;  Overall doing ok;  Pt denies CP, worsening SOB, DOE, wheezing, orthopnea, PND, worsening LE edema, palpitations, dizziness or syncope.  Pt denies neurological change such as new headache, facial or extremity weakness.  Pt denies polydipsia, polyuria, or low sugar symptoms. Pt states overall good compliance with treatment and medications, good tolerability, and has been trying to follow lower cholesterol diet.  Pt denies worsening depressive symptoms, suicidal ideation or panic. No fever, night sweats, wt loss, loss of appetite, or other constitutional symptoms.  Pt states good ability with ADL's, has low fall risk, home safety reviewed and adequate, no other significant changes in hearing or vision, and only occasionally active with exercise, though has a stationary bike to use in the near future.  Does have some discomfort with early ingrown nail on the left great toe, and chronic numbness no change.  Needs med refills.  Sees urology yrly with psa No new complaints. CBG's recently in the 120-150's. Past Medical History  Diagnosis Date  . Diabetes mellitus   . Hyperlipidemia   . Hypertension   . Cervical radiculopathy   . Cervical disc disease   . Erectile dysfunction   . Carotid stenosis   . Sciatica 07/17/2011  . Polyp of colon, adenomatous 10/27/2011    August 2013 - for f/u colonoscopy at 3 yrs   Past Surgical History  Procedure Laterality Date  . Hernia repair  1990  . Tonsillectomy  1950  . Bone spur  2012    cervical bone spur removal  . Broken pelvis  2005    reports that he has never smoked. He has never used smokeless tobacco. He reports that  drinks alcohol. He reports that he does not use illicit drugs. family history includes Heart disease in his father and mother; Stroke in his mother. There is no history of Colon cancer. Allergies  Allergen Reactions   . Viagra [Sildenafil Citrate] Other (See Comments)    headache   Current Outpatient Prescriptions on File Prior to Visit  Medication Sig Dispense Refill  . aspirin 81 MG tablet Take 81 mg by mouth daily.      . Omega-3 Fatty Acids (OMEGA 3 PO) Take by mouth once.      . vardenafil (LEVITRA) 20 MG tablet Take 1 tablet (20 mg total) by mouth as needed for erectile dysfunction.  5 tablet  11   No current facility-administered medications on file prior to visit.   Review of Systems Constitutional: Negative for diaphoresis, activity change, appetite change or unexpected weight change.  HENT: Negative for hearing loss, ear pain, facial swelling, mouth sores and neck stiffness.   Eyes: Negative for pain, redness and visual disturbance.  Respiratory: Negative for shortness of breath and wheezing.   Cardiovascular: Negative for chest pain and palpitations.  Gastrointestinal: Negative for diarrhea, blood in stool, abdominal distention or other pain Genitourinary: Negative for hematuria, flank pain or change in urine volume.  Musculoskeletal: Negative for myalgias and joint swelling.  Skin: Negative for color change and wound.  Neurological: Negative for syncope and numbness. other than noted Hematological: Negative for adenopathy.  Psychiatric/Behavioral: Negative for hallucinations, self-injury, decreased concentration and agitation.      Objective:   Physical Exam BP 118/78  Pulse 66  Temp(Src) 97.8 F (36.6 C) (Oral)  Ht 5\' 11"  (1.803 m)  Wt 163 lb  6 oz (74.106 kg)  BMI 22.8 kg/m2  SpO2 97% VS noted,  Constitutional: Pt is oriented to person, place, and time. Appears well-developed and well-nourished.  Head: Normocephalic and atraumatic.  Right Ear: External ear normal.  Left Ear: External ear normal.  Nose: Nose normal.  Mouth/Throat: Oropharynx is clear and moist.  Eyes: Conjunctivae and EOM are normal. Pupils are equal, round, and reactive to light.  Neck: Normal range of  motion. Neck supple. No JVD present. No tracheal deviation present.  Cardiovascular: Normal rate, regular rhythm, normal heart sounds and intact distal pulses.   Pulmonary/Chest: Effort normal and breath sounds normal.  Abdominal: Soft. Bowel sounds are normal. There is no tenderness. No HSM  Musculoskeletal: Normal range of motion. Exhibits no edema.  Lymphadenopathy:  Has no cervical adenopathy.  Neurological: Pt is alert and oriented to person, place, and time. Pt has normal reflexes. No cranial nerve deficit. Motor 5/5, gait intact Skin: Skin is warm and dry. No rash noted.  Psychiatric:  Has  normal mood and affect. Behavior is normal.     Assessment & Plan:

## 2012-09-26 NOTE — Assessment & Plan Note (Signed)
For podiatry referral 

## 2012-09-26 NOTE — Assessment & Plan Note (Signed)
Left chronic lumbar related

## 2012-09-26 NOTE — Assessment & Plan Note (Addendum)
stable overall by history and exam, recent data reviewed with pt, and pt to continue medical treatment as before,  to f/u any worsening symptoms or concerns BP Readings from Last 3 Encounters:  09/26/12 118/78  01/26/12 120/70  10/27/11 128/70   ECG reviewed as per emr

## 2012-10-10 ENCOUNTER — Telehealth: Payer: Self-pay | Admitting: Internal Medicine

## 2012-10-10 MED ORDER — GLIPIZIDE ER 2.5 MG PO TB24: 2.5000 mg | ORAL_TABLET | Freq: Every day | ORAL | Status: DC

## 2012-10-10 NOTE — Telephone Encounter (Signed)
Robin to let pt know; letter received  Ok to start glipizide ER 2.5 mg per day (would not take more than that for now since the a1c is so close to 7.0 to avoid low sugars)

## 2012-10-11 NOTE — Telephone Encounter (Signed)
Called left msg to call back. 

## 2012-10-15 NOTE — Telephone Encounter (Signed)
The patient returned my call and informed of medication to start.

## 2012-10-30 DIAGNOSIS — E119 Type 2 diabetes mellitus without complications: Secondary | ICD-10-CM | POA: Diagnosis not present

## 2012-10-30 DIAGNOSIS — G609 Hereditary and idiopathic neuropathy, unspecified: Secondary | ICD-10-CM | POA: Diagnosis not present

## 2012-11-19 DIAGNOSIS — R3129 Other microscopic hematuria: Secondary | ICD-10-CM | POA: Diagnosis not present

## 2012-11-19 DIAGNOSIS — N400 Enlarged prostate without lower urinary tract symptoms: Secondary | ICD-10-CM | POA: Diagnosis not present

## 2012-12-19 ENCOUNTER — Other Ambulatory Visit: Payer: Self-pay

## 2013-01-30 ENCOUNTER — Encounter: Payer: Self-pay | Admitting: Internal Medicine

## 2013-02-28 ENCOUNTER — Encounter: Payer: Self-pay | Admitting: Family Medicine

## 2013-02-28 ENCOUNTER — Other Ambulatory Visit: Payer: Self-pay | Admitting: *Deleted

## 2013-02-28 ENCOUNTER — Ambulatory Visit (INDEPENDENT_AMBULATORY_CARE_PROVIDER_SITE_OTHER): Payer: Medicare Other | Admitting: Family Medicine

## 2013-02-28 VITALS — BP 136/70 | HR 67 | Temp 97.0°F | Resp 16 | Wt 169.0 lb

## 2013-02-28 DIAGNOSIS — R109 Unspecified abdominal pain: Secondary | ICD-10-CM | POA: Diagnosis not present

## 2013-02-28 MED ORDER — CIPROFLOXACIN HCL 500 MG PO TABS: 500.0000 mg | ORAL_TABLET | Freq: Two times a day (BID) | ORAL | Status: DC

## 2013-02-28 NOTE — Patient Instructions (Signed)
Very nice to meet you I hope that this side pain is done but if come back keep a diary of the findings, when , where , what you were doing Giving antibiotic to have on hand if worsen pain or trouble with urination or fever.  Come back next week if pain is still present.

## 2013-02-28 NOTE — Progress Notes (Signed)
   CC: Side pain  HPI: Patient is a 68 year old gentleman coming in with complaint of side pain. Patient states that this pain started yesterday. Patient states that this came out of the blue and he was not do anything. Patient states that this started approximately at 4 PM and continued through the night. Patient will this morning though it had significant decrease in pain and now it seems to have almost completely resolved. Patient was the pain more on the right side costovertebral angle. Patient denies any back pain with range of motion, denies any dysuria, denies any hematuria any bowel changes or any fevers or chills. Patient does have a known history of bladder outlet obstruction. Patient states when the pain occurred it was 9/10 but now seems to be 1/10   Past medical, surgical, family and social history reviewed. Medications reviewed all in the electronic medical record.   Review of Systems: No headache, visual changes, nausea, vomiting, diarrhea, constipation, dizziness, abdominal pain, skin rash, fevers, chills, night sweats, weight loss, swollen lymph nodes, body aches, joint swelling, muscle aches, chest pain, shortness of breath, mood changes.   Objective:    Blood pressure 136/70, pulse 67, temperature 97 F (36.1 C), temperature source Oral, resp. rate 16, weight 169 lb (76.658 kg), SpO2 95.00%.  General: No apparent distress alert and oriented x3 mood and affect normal, dressed appropriately.  HEENT: Pupils equal, extraocular movements intact Respiratory: Patient's speak in full sentences and does not appear short of breath Cardiovascular: No lower extremity edema, non tender, no erythema Skin: Warm dry intact with no signs of infection or rash on extremities or on axial skeleton. Abdomen: Soft nontender, no organomegaly, bowel sounds positive in all 4 quadrants no CVA tenderness Neuro: Cranial nerves II through XII are intact, neurovascularly intact in all extremities with 2+  DTRs and 2+ pulses. Lymph: No lymphadenopathy of posterior or anterior cervical chain or axillae bilaterally.  Gait normal with good balance and coordination.  MSK: Non tender with full range of motion and good stability and symmetric strength and tone of shoulders, elbows, wrist, hip, knee and ankles bilaterally.  Back Exam:  Inspection: Unremarkable  Motion: Flexion 45 deg, Extension 45 deg, Side Bending to 45 deg bilaterally,  Rotation to 45 deg bilaterally  SLR laying: Negative  XSLR laying: Negative  Palpable tenderness: None. FABER: negative. Sensory change: Gross sensation intact to all lumbar and sacral dermatomes.  Reflexes: 2+ at both patellar tendons, 2+ at achilles tendons, Babinski's downgoing.  Strength at foot  Plantar-flexion: 5/5 Dorsi-flexion: 5/5 Eversion: 5/5 Inversion: 5/5  Leg strength  Quad: 5/5 Hamstring: 5/5 Hip flexor: 5/5 Hip abductors: 5/5  Gait unremarkable.   Impression and Recommendations:     This case required medical decision making of moderate complexity.

## 2013-02-28 NOTE — Assessment & Plan Note (Signed)
Patient side pain lasted less than 24 hours, was very intense and then seems to resolve. Based on patient's location I would be concerned for potential urinary tract infection vs gi system. With patient's history of bladder outlet obstruction with patient was given a week and see antibiotic secondary to this going on to weaken. Patient will keep a diary when this pain when occurred again. Patient has had a polyp of the colon before and if this continues we may need to consider further imaging as well as testing but I do not feel strongly needing at this time Patient can follow up next week if he has any recurrent pain but otherwise he will followup as needed.

## 2013-03-25 ENCOUNTER — Ambulatory Visit: Payer: Medicare Other | Admitting: Internal Medicine

## 2013-03-27 ENCOUNTER — Encounter: Payer: Self-pay | Admitting: Internal Medicine

## 2013-03-27 ENCOUNTER — Ambulatory Visit (INDEPENDENT_AMBULATORY_CARE_PROVIDER_SITE_OTHER): Payer: Medicare Other | Admitting: Internal Medicine

## 2013-03-27 VITALS — BP 130/78 | HR 73 | Temp 97.3°F | Wt 168.0 lb

## 2013-03-27 DIAGNOSIS — E1149 Type 2 diabetes mellitus with other diabetic neurological complication: Secondary | ICD-10-CM

## 2013-03-27 DIAGNOSIS — Z23 Encounter for immunization: Secondary | ICD-10-CM | POA: Diagnosis not present

## 2013-03-27 DIAGNOSIS — E785 Hyperlipidemia, unspecified: Secondary | ICD-10-CM

## 2013-03-27 DIAGNOSIS — I10 Essential (primary) hypertension: Secondary | ICD-10-CM

## 2013-03-27 DIAGNOSIS — E1165 Type 2 diabetes mellitus with hyperglycemia: Secondary | ICD-10-CM

## 2013-03-27 DIAGNOSIS — IMO0001 Reserved for inherently not codable concepts without codable children: Secondary | ICD-10-CM

## 2013-03-27 DIAGNOSIS — E1140 Type 2 diabetes mellitus with diabetic neuropathy, unspecified: Secondary | ICD-10-CM

## 2013-03-27 DIAGNOSIS — E1142 Type 2 diabetes mellitus with diabetic polyneuropathy: Secondary | ICD-10-CM

## 2013-03-27 DIAGNOSIS — Z0000 Encounter for general adult medical examination without abnormal findings: Secondary | ICD-10-CM

## 2013-03-27 MED ORDER — LOSARTAN POTASSIUM 25 MG PO TABS: 25.0000 mg | ORAL_TABLET | Freq: Every day | ORAL | Status: DC

## 2013-03-27 MED ORDER — ATORVASTATIN CALCIUM 10 MG PO TABS: 10.0000 mg | ORAL_TABLET | Freq: Every day | ORAL | Status: DC

## 2013-03-27 NOTE — Progress Notes (Signed)
Subjective:    Patient ID: Vincent Kent, male    DOB: December 10, 1945, 68 y.o.   MRN: [id number]  HPI Here to f/u; overall doing ok,  Pt denies chest pain, increased sob or doe, wheezing, orthopnea, PND, increased LE swelling, palpitations, dizziness or syncope.  Pt denies polydipsia, polyuria, or low sugar symptoms such as weakness or confusion improved with po intake.  Pt denies new neurological symptoms such as new headache, or facial or extremity weakness or numbness.   Pt states overall good compliance with meds, has been trying to follow lower cholesterol, diabetic diet, with wt overall stable,  but little exercise however.  Only taking the glipizide ER 2.5 mg about 3 days per wk for am glc more than 120, to avoid low sugars Past Medical History  Diagnosis Date  . Diabetes mellitus   . Hyperlipidemia   . Hypertension   . Cervical radiculopathy   . Cervical disc disease   . Erectile dysfunction   . Carotid stenosis   . Sciatica 07/17/2011  . Polyp of colon, adenomatous 10/27/2011    August 2013 - for f/u colonoscopy at 3 yrs   Past Surgical History  Procedure Laterality Date  . Hernia repair  1990  . Tonsillectomy  1950  . Bone spur  2012    cervical bone spur removal  . Broken pelvis  2005    reports that he has never smoked. He has never used smokeless tobacco. He reports that he drinks alcohol. He reports that he does not use illicit drugs. family history includes Heart disease in his father and mother; Stroke in his mother. There is no history of Colon cancer. Allergies  Allergen Reactions  . Viagra [Sildenafil Citrate] Other (See Comments)    headache   Current Outpatient Prescriptions on File Prior to Visit  Medication Sig Dispense Refill  . aspirin 81 MG tablet Take 81 mg by mouth daily.      . ciprofloxacin (CIPRO) 500 MG tablet Take 1 tablet (500 mg total) by mouth 2 (two) times daily.  42 tablet  0  . glipiZIDE (GLUCOTROL XL) 2.5 MG 24 hr tablet Take 1 tablet  (2.5 mg total) by mouth daily.  90 tablet  3  . glucose blood (ONE TOUCH ULTRA TEST) test strip Use as directed twice daily as directed.  Diagnosis code 250.02  300 each  3  . Omega-3 Fatty Acids (OMEGA 3 PO) Take by mouth once.      . sitaGLIPtan-metformin (JANUMET) 50-1000 MG per tablet Take 1 tablet by mouth 2 (two) times daily with a meal.  180 tablet  3  . tadalafil (CIALIS) 20 MG tablet Take 1 tablet (20 mg total) by mouth daily as needed for erectile dysfunction. 6 monthly  18 tablet  3  . vardenafil (LEVITRA) 20 MG tablet Take 1 tablet (20 mg total) by mouth as needed for erectile dysfunction.  5 tablet  11   No current facility-administered medications on file prior to visit.   Review of Systems  Constitutional: Negative for unexpected weight change, or unusual diaphoresis  HENT: Negative for tinnitus.   Eyes: Negative for photophobia and visual disturbance.  Respiratory: Negative for choking and stridor.   Gastrointestinal: Negative for vomiting and blood in stool.  Genitourinary: Negative for hematuria and decreased urine volume.  Musculoskeletal: Negative for acute joint swelling Skin: Negative for color change and wound.  Neurological: Negative for tremors and numbness other than noted  Psychiatric/Behavioral: Negative for decreased concentration or  hyperactivity.       Objective:   Physical Exam BP 130/78  Pulse 73  Temp(Src) 97.3 F (36.3 C) (Oral)  Wt 168 lb (76.204 kg)  SpO2 95% VS noted,  Constitutional: Pt appears well-developed and well-nourished.  HENT: Head: NCAT.  Right Ear: External ear normal.  Left Ear: External ear normal.  Eyes: Conjunctivae and EOM are normal. Pupils are equal, round, and reactive to light.  Neck: Normal range of motion. Neck supple.  Cardiovascular: Normal rate and regular rhythm.   Pulmonary/Chest: Effort normal and breath sounds normal.  Neurological: Pt is alert. Not confused  Skin: Skin is warm. No erythema.  Psychiatric: Pt  behavior is normal. Thought content normal.     Assessment & Plan:

## 2013-03-27 NOTE — Addendum Note (Signed)
Addended by: Sharon Seller B on: 03/27/2013 03:24 PM   Modules accepted: Orders

## 2013-03-27 NOTE — Assessment & Plan Note (Signed)
stable overall by history and exam, recent data reviewed with pt, and pt to continue medical treatment as before,  to f/u any worsening symptoms or concerns Lab Results  Component Value Date   LDLCALC 82 09/26/2012

## 2013-03-27 NOTE — Patient Instructions (Addendum)
You had the new Prevnar pneumonia shots today Please continue all other medications as before, and refills have been done if requested. Please have the pharmacy call with any other refills you may need.  Please go to the LAB in the Basement (turn left off the elevator) for the tests to be done today You will be contacted by phone if any changes need to be made immediately.  Otherwise, you will receive a letter about your results with an explanation, but please check with MyChart first.  Please remember to sign up for MyChart if you have not done so, as this will be important to you in the future with finding out test results, communicating by private email, and scheduling acute appointments online when needed.  Please return in 6 months, or sooner if needed

## 2013-03-27 NOTE — Assessment & Plan Note (Signed)
stable overall by history and exam, recent data reviewed with pt, and pt to continue medical treatment as before,  to f/u any worsening symptoms or concerns Lab Results  Component Value Date   HGBA1C 7.2* 09/26/2012   For f/u labs

## 2013-03-27 NOTE — Assessment & Plan Note (Signed)
stable overall by history and exam, recent data reviewed with pt, and pt to continue medical treatment as before,  to f/u any worsening symptoms or concerns BP Readings from Last 3 Encounters:  03/27/13 130/78  02/28/13 136/70  09/26/12 118/78

## 2013-03-27 NOTE — Progress Notes (Signed)
Pre-visit discussion using our clinic review tool. No additional management support is needed unless otherwise documented below in the visit note.  

## 2013-03-28 ENCOUNTER — Telehealth: Payer: Self-pay

## 2013-03-28 NOTE — Telephone Encounter (Signed)
Relevant patient education assigned to patient using Emmi. ° °

## 2013-09-24 ENCOUNTER — Ambulatory Visit: Payer: Medicare Other | Admitting: Internal Medicine

## 2013-09-26 DIAGNOSIS — K7689 Other specified diseases of liver: Secondary | ICD-10-CM | POA: Diagnosis not present

## 2013-09-26 DIAGNOSIS — E1149 Type 2 diabetes mellitus with other diabetic neurological complication: Secondary | ICD-10-CM | POA: Diagnosis not present

## 2013-09-26 DIAGNOSIS — I10 Essential (primary) hypertension: Secondary | ICD-10-CM | POA: Diagnosis not present

## 2013-09-26 DIAGNOSIS — E785 Hyperlipidemia, unspecified: Secondary | ICD-10-CM | POA: Diagnosis not present

## 2013-09-26 DIAGNOSIS — K862 Cyst of pancreas: Secondary | ICD-10-CM | POA: Diagnosis not present

## 2013-09-26 DIAGNOSIS — K863 Pseudocyst of pancreas: Secondary | ICD-10-CM | POA: Diagnosis not present

## 2013-09-29 DIAGNOSIS — D239 Other benign neoplasm of skin, unspecified: Secondary | ICD-10-CM | POA: Diagnosis not present

## 2013-09-29 DIAGNOSIS — D72829 Elevated white blood cell count, unspecified: Secondary | ICD-10-CM | POA: Diagnosis not present

## 2013-09-29 DIAGNOSIS — L820 Inflamed seborrheic keratosis: Secondary | ICD-10-CM | POA: Diagnosis not present

## 2013-10-01 ENCOUNTER — Encounter: Payer: Self-pay | Admitting: Gastroenterology

## 2013-10-16 ENCOUNTER — Ambulatory Visit: Payer: Medicare Other | Admitting: Gastroenterology

## 2013-10-31 ENCOUNTER — Encounter: Payer: Self-pay | Admitting: Gastroenterology

## 2013-10-31 ENCOUNTER — Ambulatory Visit (INDEPENDENT_AMBULATORY_CARE_PROVIDER_SITE_OTHER): Payer: Medicare Other | Admitting: Gastroenterology

## 2013-10-31 ENCOUNTER — Other Ambulatory Visit (INDEPENDENT_AMBULATORY_CARE_PROVIDER_SITE_OTHER): Payer: Medicare Other

## 2013-10-31 VITALS — BP 112/70 | HR 84 | Ht 69.5 in | Wt 161.2 lb

## 2013-10-31 DIAGNOSIS — K863 Pseudocyst of pancreas: Secondary | ICD-10-CM | POA: Diagnosis not present

## 2013-10-31 DIAGNOSIS — K862 Cyst of pancreas: Secondary | ICD-10-CM | POA: Insufficient documentation

## 2013-10-31 DIAGNOSIS — K7689 Other specified diseases of liver: Secondary | ICD-10-CM | POA: Diagnosis not present

## 2013-10-31 LAB — BASIC METABOLIC PANEL
BUN: 23 mg/dL (ref 6–23)
CO2: 29 mEq/L (ref 19–32)
CREATININE: 1.1 mg/dL (ref 0.4–1.5)
Calcium: 10.1 mg/dL (ref 8.4–10.5)
Chloride: 104 mEq/L (ref 96–112)
GFR: 67.44 mL/min (ref >60.00)
GLUCOSE: 258 mg/dL — AB (ref 70–99)
Potassium: 5 mEq/L (ref 3.5–5.1)
Sodium: 140 mEq/L (ref 135–145)

## 2013-10-31 NOTE — Progress Notes (Signed)
     10/31/2013 Vincent Kent [id number] 22-Aug-1945   History of Present Illness:  This is a pleasant 68 year old male who is previously known to Dr. Sharlett Iles for colonoscopy in 09/2011 at which time he was found to have diverticulosis and one tubular adenoma removed from the sigmoid colon; repeat colonoscopy recommended in 5 years from that time.  He presents to our office today to discuss MRI follow-up for some pancreatic cysts that were seen on a previous MRI in 2013.  MRI of the abdomen showed the following:  "IMPRESSION:  1. Multiple small cystic lesions scattered throughout the  pancreas, as above. 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 Although the main pancreatic duct is not dilated, there is a suggestion of some side branch ectasia in the body of the pancreas. Accordingly, the possibility of multicentric IPMN (intraductal papillary  mucinous neoplasm) is difficult to exclude, and close attention on follow-up studies is recommended. At this time, repeat pancreatic protocol MRI with and without Gadolinium in 6 months is recommended.  2. Hepatic steatosis.  3. 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   Current Medications, Allergies, Past Medical History, Past Surgical History, Family History and Social History were reviewed in [employer] record.   Physical Exam: BP 112/70  Pulse 84  Ht 5' 9.5" (1.765 m)  Wt 161 lb 4 oz (73.143 kg)  BMI 23.48 kg/m2 General: Well developed white male in no acute distress Head: Normocephalic and atraumatic Eyes:  Sclerae anicteric, conjunctiva pink  Ears: Normal auditory acuity Lungs: Clear throughout to auscultation Heart: Regular rate and rhythm Abdomen: Soft, non-distended.  Normal bowel sounds.  Non-tender. Musculoskeletal: Symmetrical with no gross deformities  Extremities: No edema    Neurological: Alert oriented x 4, grossly non-focal Psychological:  Alert and cooperative. Normal mood and affect  Assessment and Recommendations: -Pancreatic cysts:  Will follow-up with MRI abdomen/MRCP with Gadolinium contrast.  Will check CA19-9 tumor marker as well.

## 2013-10-31 NOTE — Progress Notes (Signed)
Reviewed and agree with management. Margarite Vessel D. Griff Badley, M.D., FACG  

## 2013-10-31 NOTE — Patient Instructions (Signed)
You have been scheduled for an MRI/MRCP at [facility] (1st floor of hospital) on 11-11-2013 at 7:00 pm. Please arrive 15 minutes prior to your appointment for registration. Make certain not to have anything to eat or drink 6 hours prior to your appointment. Should you need to reschedule your appointment, please contact radiology at [phone]. This test typically takes about 30 minutes to perform.  Your physician has requested that you go to the basement for the following lab work before leaving today: CA 19-9 BMET

## 2013-11-01 LAB — CANCER ANTIGEN 19-9: CA 19-9: 11.8 U/mL (ref <35.0)

## 2013-11-10 ENCOUNTER — Encounter: Payer: Self-pay | Admitting: *Deleted

## 2013-11-11 ENCOUNTER — Ambulatory Visit (HOSPITAL_COMMUNITY)
Admission: RE | Admit: 2013-11-11 | Discharge: 2013-11-11 | Disposition: A | Discharge status: Home / Self Care | Payer: Medicare Other | Source: Ambulatory Visit | Attending: Gastroenterology | Admitting: Gastroenterology

## 2013-11-11 ENCOUNTER — Other Ambulatory Visit: Payer: Self-pay | Admitting: Gastroenterology

## 2013-11-11 DIAGNOSIS — K863 Pseudocyst of pancreas: Principal | ICD-10-CM

## 2013-11-11 DIAGNOSIS — K862 Cyst of pancreas: Secondary | ICD-10-CM

## 2013-11-11 MED ORDER — GADOBENATE DIMEGLUMINE 529 MG/ML IV SOLN
15.0000 mL | Freq: Once | INTRAVENOUS | Status: AC | PRN
  Administered 2013-11-11: 15 mL via INTRAVENOUS

## 2013-11-12 DIAGNOSIS — K862 Cyst of pancreas: Secondary | ICD-10-CM | POA: Diagnosis not present

## 2013-11-12 DIAGNOSIS — K863 Pseudocyst of pancreas: Secondary | ICD-10-CM | POA: Diagnosis not present

## 2014-01-30 DIAGNOSIS — H2513 Age-related nuclear cataract, bilateral: Secondary | ICD-10-CM | POA: Diagnosis not present

## 2014-01-30 DIAGNOSIS — E119 Type 2 diabetes mellitus without complications: Secondary | ICD-10-CM | POA: Diagnosis not present

## 2014-01-30 DIAGNOSIS — H43813 Vitreous degeneration, bilateral: Secondary | ICD-10-CM | POA: Diagnosis not present

## 2014-01-30 DIAGNOSIS — H25013 Cortical age-related cataract, bilateral: Secondary | ICD-10-CM | POA: Diagnosis not present

## 2014-04-01 DIAGNOSIS — M2012 Hallux valgus (acquired), left foot: Secondary | ICD-10-CM | POA: Diagnosis not present

## 2014-04-01 DIAGNOSIS — B351 Tinea unguium: Secondary | ICD-10-CM | POA: Diagnosis not present

## 2014-04-01 DIAGNOSIS — L602 Onychogryphosis: Secondary | ICD-10-CM | POA: Diagnosis not present

## 2014-04-01 DIAGNOSIS — M2011 Hallux valgus (acquired), right foot: Secondary | ICD-10-CM | POA: Diagnosis not present

## 2014-04-17 DIAGNOSIS — N529 Male erectile dysfunction, unspecified: Secondary | ICD-10-CM | POA: Diagnosis not present

## 2014-04-17 DIAGNOSIS — E118 Type 2 diabetes mellitus with unspecified complications: Secondary | ICD-10-CM | POA: Diagnosis not present

## 2014-04-17 DIAGNOSIS — E119 Type 2 diabetes mellitus without complications: Secondary | ICD-10-CM | POA: Diagnosis not present

## 2014-04-24 DIAGNOSIS — M2011 Hallux valgus (acquired), right foot: Secondary | ICD-10-CM | POA: Diagnosis not present

## 2014-04-24 DIAGNOSIS — M2012 Hallux valgus (acquired), left foot: Secondary | ICD-10-CM | POA: Diagnosis not present

## 2014-07-03 DIAGNOSIS — M0589 Other rheumatoid arthritis with rheumatoid factor of multiple sites: Secondary | ICD-10-CM | POA: Diagnosis not present

## 2014-07-20 DIAGNOSIS — E118 Type 2 diabetes mellitus with unspecified complications: Secondary | ICD-10-CM | POA: Diagnosis not present

## 2014-07-20 DIAGNOSIS — E780 Pure hypercholesterolemia: Secondary | ICD-10-CM | POA: Diagnosis not present

## 2014-11-24 DIAGNOSIS — I10 Essential (primary) hypertension: Secondary | ICD-10-CM | POA: Diagnosis not present

## 2014-11-24 DIAGNOSIS — Z0000 Encounter for general adult medical examination without abnormal findings: Secondary | ICD-10-CM | POA: Diagnosis not present

## 2014-11-24 DIAGNOSIS — E7800 Pure hypercholesterolemia, unspecified: Secondary | ICD-10-CM | POA: Diagnosis not present

## 2014-11-24 DIAGNOSIS — Z125 Encounter for screening for malignant neoplasm of prostate: Secondary | ICD-10-CM | POA: Diagnosis not present

## 2014-11-30 DIAGNOSIS — E1149 Type 2 diabetes mellitus with other diabetic neurological complication: Secondary | ICD-10-CM | POA: Diagnosis not present

## 2014-11-30 DIAGNOSIS — E7800 Pure hypercholesterolemia, unspecified: Secondary | ICD-10-CM | POA: Diagnosis not present

## 2014-11-30 DIAGNOSIS — Z8781 Personal history of (healed) traumatic fracture: Secondary | ICD-10-CM | POA: Diagnosis not present

## 2014-11-30 DIAGNOSIS — Z86010 Personal history of colonic polyps: Secondary | ICD-10-CM | POA: Diagnosis not present

## 2014-11-30 DIAGNOSIS — E875 Hyperkalemia: Secondary | ICD-10-CM | POA: Diagnosis not present

## 2015-02-01 DIAGNOSIS — H25013 Cortical age-related cataract, bilateral: Secondary | ICD-10-CM | POA: Diagnosis not present

## 2015-02-01 DIAGNOSIS — H2513 Age-related nuclear cataract, bilateral: Secondary | ICD-10-CM | POA: Diagnosis not present

## 2015-02-01 DIAGNOSIS — H52203 Unspecified astigmatism, bilateral: Secondary | ICD-10-CM | POA: Diagnosis not present

## 2015-02-01 DIAGNOSIS — E119 Type 2 diabetes mellitus without complications: Secondary | ICD-10-CM | POA: Diagnosis not present

## 2015-03-19 ENCOUNTER — Encounter (HOSPITAL_COMMUNITY): Payer: Self-pay | Admitting: *Deleted

## 2015-03-19 ENCOUNTER — Emergency Department (INDEPENDENT_AMBULATORY_CARE_PROVIDER_SITE_OTHER)
Admission: EM | Admit: 2015-03-19 | Discharge: 2015-03-19 | Disposition: A | Discharge status: Home / Self Care | Payer: Medicare Other | Source: Home / Self Care | Attending: Family Medicine | Admitting: Family Medicine

## 2015-03-19 ENCOUNTER — Encounter (HOSPITAL_COMMUNITY): Payer: Self-pay

## 2015-03-19 ENCOUNTER — Emergency Department (HOSPITAL_COMMUNITY)
Admission: EM | Admit: 2015-03-19 | Discharge: 2015-03-19 | Disposition: A | Discharge status: Home / Self Care | Payer: Medicare Other | Attending: Physician Assistant | Admitting: Physician Assistant

## 2015-03-19 ENCOUNTER — Emergency Department (HOSPITAL_COMMUNITY): Payer: Medicare Other

## 2015-03-19 DIAGNOSIS — I10 Essential (primary) hypertension: Secondary | ICD-10-CM | POA: Diagnosis not present

## 2015-03-19 DIAGNOSIS — E119 Type 2 diabetes mellitus without complications: Secondary | ICD-10-CM | POA: Diagnosis not present

## 2015-03-19 DIAGNOSIS — E785 Hyperlipidemia, unspecified: Secondary | ICD-10-CM | POA: Insufficient documentation

## 2015-03-19 DIAGNOSIS — N200 Calculus of kidney: Secondary | ICD-10-CM

## 2015-03-19 DIAGNOSIS — M79605 Pain in left leg: Secondary | ICD-10-CM | POA: Diagnosis not present

## 2015-03-19 DIAGNOSIS — R1030 Lower abdominal pain, unspecified: Secondary | ICD-10-CM | POA: Diagnosis present

## 2015-03-19 DIAGNOSIS — N134 Hydroureter: Secondary | ICD-10-CM | POA: Diagnosis not present

## 2015-03-19 DIAGNOSIS — Z79899 Other long term (current) drug therapy: Secondary | ICD-10-CM | POA: Insufficient documentation

## 2015-03-19 DIAGNOSIS — N132 Hydronephrosis with renal and ureteral calculous obstruction: Secondary | ICD-10-CM | POA: Diagnosis not present

## 2015-03-19 HISTORY — DX: Type 2 diabetes mellitus without complications: E11.9

## 2015-03-19 LAB — POCT URINALYSIS DIP (DEVICE)
GLUCOSE, UA: NEGATIVE mg/dL
GLUCOSE, UA: NEGATIVE mg/dL
KETONES UR: NEGATIVE mg/dL
KETONES UR: NEGATIVE mg/dL
LEUKOCYTES UA: NEGATIVE
Leukocytes, UA: NEGATIVE
Nitrite: NEGATIVE
Nitrite: NEGATIVE
PROTEIN: NEGATIVE mg/dL
Protein, ur: NEGATIVE mg/dL
UROBILINOGEN UA: 0.2 mg/dL (ref 0.0–1.0)
Urobilinogen, UA: 0.2 mg/dL (ref 0.0–1.0)
pH: 5 (ref 5.0–8.0)
pH: 5.5 (ref 5.0–8.0)

## 2015-03-19 LAB — CBC WITH DIFFERENTIAL/PLATELET
Basophils Absolute: 0 10*3/uL (ref 0.0–0.1)
Basophils Relative: 0 %
EOS ABS: 0 10*3/uL (ref 0.0–0.7)
EOS PCT: 0 %
HCT: 40.8 % (ref 39.0–52.0)
Hemoglobin: 13.7 g/dL (ref 13.0–17.0)
LYMPHS ABS: 0.9 10*3/uL (ref 0.7–4.0)
LYMPHS PCT: 7 %
MCH: 32.3 pg (ref 26.0–34.0)
MCHC: 33.6 g/dL (ref 30.0–36.0)
MCV: 96.2 fL (ref 78.0–100.0)
MONO ABS: 0.5 10*3/uL (ref 0.1–1.0)
MONOS PCT: 4 %
Neutro Abs: 11.5 10*3/uL — ABNORMAL HIGH (ref 1.7–7.7)
Neutrophils Relative %: 89 %
PLATELETS: 186 10*3/uL (ref 150–400)
RBC: 4.24 MIL/uL (ref 4.22–5.81)
RDW: 12.8 % (ref 11.5–15.5)
WBC: 12.9 10*3/uL — AB (ref 4.0–10.5)

## 2015-03-19 LAB — URINALYSIS, ROUTINE W REFLEX MICROSCOPIC
Glucose, UA: NEGATIVE mg/dL
KETONES UR: 15 mg/dL — AB
LEUKOCYTES UA: NEGATIVE
NITRITE: NEGATIVE
PROTEIN: NEGATIVE mg/dL
Specific Gravity, Urine: 1.03 (ref 1.005–1.030)
pH: 5 (ref 5.0–8.0)

## 2015-03-19 LAB — URINE MICROSCOPIC-ADD ON

## 2015-03-19 LAB — COMPREHENSIVE METABOLIC PANEL
ALBUMIN: 4.4 g/dL (ref 3.5–5.0)
ALT: 23 U/L (ref 17–63)
AST: 27 U/L (ref 15–41)
Alkaline Phosphatase: 61 U/L (ref 38–126)
Anion gap: 12 (ref 5–15)
BUN: 20 mg/dL (ref 6–20)
CHLORIDE: 108 mmol/L (ref 101–111)
CO2: 24 mmol/L (ref 22–32)
CREATININE: 1.24 mg/dL (ref 0.61–1.24)
Calcium: 9.6 mg/dL (ref 8.9–10.3)
GFR calc Af Amer: >60 mL/min (ref >60)
GFR calc non Af Amer: 57 mL/min — ABNORMAL LOW (ref >60)
GLUCOSE: 190 mg/dL — AB (ref 65–99)
POTASSIUM: 4.7 mmol/L (ref 3.5–5.1)
SODIUM: 144 mmol/L (ref 135–145)
Total Bilirubin: 0.9 mg/dL (ref 0.3–1.2)
Total Protein: 7.6 g/dL (ref 6.5–8.1)

## 2015-03-19 MED ORDER — ONDANSETRON 4 MG PO TBDP
ORAL_TABLET | ORAL | Status: AC
  Filled 2015-03-19: qty 1

## 2015-03-19 MED ORDER — HYDROMORPHONE HCL 1 MG/ML IJ SOLN
INTRAMUSCULAR | Status: AC
  Filled 2015-03-19: qty 2

## 2015-03-19 MED ORDER — ONDANSETRON 4 MG PO TBDP
4.0000 mg | ORAL_TABLET | Freq: Once | ORAL | Status: AC
  Administered 2015-03-19: 4 mg via ORAL

## 2015-03-19 MED ORDER — ONDANSETRON HCL 4 MG PO TABS: 4.0000 mg | ORAL_TABLET | Freq: Three times a day (TID) | ORAL | Status: DC | PRN

## 2015-03-19 MED ORDER — OXYCODONE-ACETAMINOPHEN 5-325 MG PO TABS: 1.0000 | ORAL_TABLET | Freq: Four times a day (QID) | ORAL | Status: DC | PRN

## 2015-03-19 MED ORDER — HYDROMORPHONE HCL 1 MG/ML IJ SOLN
2.0000 mg | Freq: Once | INTRAMUSCULAR | Status: AC
  Administered 2015-03-19: 2 mg via INTRAMUSCULAR

## 2015-03-19 MED ORDER — TAMSULOSIN HCL 0.4 MG PO CAPS: 0.4000 mg | ORAL_CAPSULE | Freq: Every day | ORAL | Status: DC

## 2015-03-19 NOTE — ED Notes (Signed)
Pt  Reports  l  Lower  abd  Pain      With  Pain   Pain  Radiating  To     l  Flank     Pt  Has  Some  Nausea  He      Reports        He  Has  Had  A  Kidney  Stone  In  Past     he  Is  Restless    And    Cannot    Stay  Still  He  Is  Pale

## 2015-03-19 NOTE — Discharge Instructions (Signed)
Return with Fever.  Kidney Stones Kidney stones (urolithiasis) are deposits that form inside your kidneys. The intense pain is caused by the stone moving through the urinary tract. When the stone moves, the ureter goes into spasm around the stone. The stone is usually passed in the urine.  CAUSES   A disorder that makes certain neck glands produce too much parathyroid hormone (primary hyperparathyroidism).  A buildup of uric acid crystals, similar to gout in your joints.  Narrowing (stricture) of the ureter.  A kidney obstruction present at birth (congenital obstruction).  Previous surgery on the kidney or ureters.  Numerous kidney infections. SYMPTOMS   Feeling sick to your stomach (nauseous).  Throwing up (vomiting).  Blood in the urine (hematuria).  Pain that usually spreads (radiates) to the groin.  Frequency or urgency of urination. DIAGNOSIS   Taking a history and physical exam.  Blood or urine tests.  CT scan.  Occasionally, an examination of the inside of the urinary bladder (cystoscopy) is performed. TREATMENT   Observation.  Increasing your fluid intake.  Extracorporeal shock wave lithotripsy--This is a noninvasive procedure that uses shock waves to break up kidney stones.  Surgery may be needed if you have severe pain or persistent obstruction. There are various surgical procedures. Most of the procedures are performed with the use of small instruments. Only small incisions are needed to accommodate these instruments, so recovery time is minimized. The size, location, and chemical composition are all important variables that will determine the proper choice of action for you. Talk to your health care provider to better understand your situation so that you will minimize the risk of injury to yourself and your kidney.  HOME CARE INSTRUCTIONS   Drink enough water and fluids to keep your urine clear or pale yellow. This will help you to pass the stone or stone  fragments.  Strain all urine through the provided strainer. Keep all particulate matter and stones for your health care provider to see. The stone causing the pain may be as small as a grain of salt. It is very important to use the strainer each and every time you pass your urine. The collection of your stone will allow your health care provider to analyze it and verify that a stone has actually passed. The stone analysis will often identify what you can do to reduce the incidence of recurrences.  Only take over-the-counter or prescription medicines for pain, discomfort, or fever as directed by your health care provider.  Keep all follow-up visits as told by your health care provider. This is important.  Get follow-up X-rays if required. The absence of pain does not always mean that the stone has passed. It may have only stopped moving. If the urine remains completely obstructed, it can cause loss of kidney function or even complete destruction of the kidney. It is your responsibility to make sure X-rays and follow-ups are completed. Ultrasounds of the kidney can show blockages and the status of the kidney. Ultrasounds are not associated with any radiation and can be performed easily in a matter of minutes.  Make changes to your daily diet as told by your health care provider. You may be told to:  Limit the amount of salt that you eat.  Eat 5 or more servings of fruits and vegetables each day.  Limit the amount of meat, poultry, fish, and eggs that you eat.  Collect a 24-hour urine sample as told by your health care provider.You may need to collect another  urine sample every 6-12 months. SEEK MEDICAL CARE IF:  You experience pain that is progressive and unresponsive to any pain medicine you have been prescribed. SEEK IMMEDIATE MEDICAL CARE IF:   Pain cannot be controlled with the prescribed medicine.  You have a fever or shaking chills.  The severity or intensity of pain increases over  18 hours and is not relieved by pain medicine.  You develop a new onset of abdominal pain.  You feel faint or pass out.  You are unable to urinate.   This information is not intended to replace advice given to you by your health care provider. Make sure you discuss any questions you have with your health care provider.   Document Released: 01/30/2005 Document Revised: 10/21/2014 Document Reviewed: 07/03/2012 Elsevier Interactive Patient Education [employer].

## 2015-03-19 NOTE — Discharge Instructions (Signed)
Go dierectly to see dr Matilde Sprang for further eval.

## 2015-03-19 NOTE — ED Notes (Signed)
Per pt, started having left flank pain radiating to testicle and groin.  Urology stated kidney stone but no scans confirmed.  MD told him to come here.  No change in urination.

## 2015-03-19 NOTE — ED Provider Notes (Signed)
CSN: [id number]     Arrival date & time 03/19/15  1332 History   First MD Initiated Contact with Patient 03/19/15 1354     Chief Complaint  Patient presents with  . Abdominal Pain   (Consider location/radiation/quality/duration/timing/severity/associated sxs/prior Treatment) Patient is a 70 y.o. male presenting with abdominal pain.  Abdominal Pain Pain location:  Suprapubic Pain quality: sharp   Pain radiates to:  Scrotum Duration:  3 hours Progression:  Worsening Chronicity:  New Context: not retching   Relieved by:  None tried Worsened by:  Nothing tried Ineffective treatments:  None tried Associated symptoms: no fever, no nausea and no vomiting   Associated symptoms comment:  No change in sx with bm., no blood seen.   Past Medical History  Diagnosis Date  . Diabetes mellitus   . Hyperlipidemia   . Hypertension   . Cervical radiculopathy   . Cervical disc disease   . Erectile dysfunction   . Carotid stenosis   . Sciatica 07/17/2011  . Polyp of colon, adenomatous 10/27/2011    August 2013 - for f/u colonoscopy at 3 yrs  . Pelvic fracture ([place])   . Hepatic steatosis    Past Surgical History  Procedure Laterality Date  . Inguinal hernia repair Bilateral 1990    left x 2,right 1  . Tonsillectomy  1949  . Bone spur  2012    cervical bone spur removal   Family History  Problem Relation Age of Onset  . Heart disease Mother   . Stroke Mother   . Heart disease Father   . Colon cancer Neg Hx   . Cancer Mother     type unknown  . Dementia Mother    Social History  Substance Use Topics  . Smoking status: Never Smoker   . Smokeless tobacco: Never Used  . Alcohol Use: Yes     Comment: 4 per month    Review of Systems  Constitutional: Negative for fever.  Gastrointestinal: Positive for abdominal pain. Negative for nausea and vomiting.  Genitourinary: Positive for flank pain and testicular pain. Negative for discharge and penile pain.  All other systems reviewed and  are negative.   Allergies  Viagra  Home Medications   Prior to Admission medications   Medication Sig Start Date End Date Taking? Authorizing Provider  aspirin 81 MG tablet Take 81 mg by mouth daily.    Historical Provider, MD  atorvastatin (LIPITOR) 10 MG tablet Take 1 tablet (10 mg total) by mouth daily. 03/27/13   Biagio Borg, MD  glipiZIDE (GLUCOTROL XL) 2.5 MG 24 hr tablet Take 1 tablet (2.5 mg total) by mouth daily. 10/10/12   Biagio Borg, MD  glucose blood (ONE TOUCH ULTRA TEST) test strip Use as directed twice daily as directed.  Diagnosis code 250.02 09/26/12   Biagio Borg, MD  lisinopril (PRINIVIL,ZESTRIL) 10 MG tablet Take 10 mg by mouth daily.    Historical Provider, MD  Omega-3 Fatty Acids (OMEGA 3 PO) Take by mouth once.    Historical Provider, MD  sitaGLIPtan-metformin (JANUMET) 50-1000 MG per tablet Take 1 tablet by mouth 2 (two) times daily with a meal. 09/26/12 10/31/13  Biagio Borg, MD  tadalafil (CIALIS) 20 MG tablet Take 1 tablet (20 mg total) by mouth daily as needed for erectile dysfunction. 6 monthly 09/26/12   Biagio Borg, MD   Meds Ordered and Administered this Visit   Medications  ondansetron (ZOFRAN-ODT) disintegrating tablet 4 mg (4 mg Oral Given 03/19/15  1410)  HYDROmorphone (DILAUDID) injection 2 mg (2 mg Intramuscular Given 03/19/15 1410)    BP 162/75 mmHg  Pulse 69  Temp(Src) 98.6 F (37 C) (Oral)  Resp 16  SpO2 98% No data found.   Physical Exam  Constitutional: He is oriented to person, place, and time. He appears well-developed and well-nourished.  Cardiovascular: Regular rhythm and normal heart sounds.   Abdominal: Soft. Bowel sounds are normal. He exhibits no distension and no mass. There is tenderness. There is no rebound and no guarding. Hernia confirmed negative in the right inguinal area and confirmed negative in the left inguinal area.  Genitourinary: Penis normal. Right testis shows no tenderness. Left testis shows tenderness. Left testis  shows no swelling. No discharge found.  Lymphadenopathy:       Right: No inguinal adenopathy present.       Left: No inguinal adenopathy present.  Neurological: He is alert and oriented to person, place, and time.  Skin: Skin is warm and dry.  Nursing note and vitals reviewed.   ED Course  Procedures (including critical care time)  Labs Review Labs Reviewed  POCT URINALYSIS DIP (DEVICE) - Abnormal; Notable for the following:    Bilirubin Urine SMALL (*)    Hgb urine dipstick LARGE (*)    All other components within normal limits  POCT URINALYSIS DIP (DEVICE) - Abnormal; Notable for the following:    Bilirubin Urine SMALL (*)    Hgb urine dipstick MODERATE (*)    All other components within normal limits    Imaging Review No results found.   Visual Acuity Review  Right Eye Distance:   Left Eye Distance:   Bilateral Distance:    Right Eye Near:   Left Eye Near:    Bilateral Near:         MDM   1. Kidney stone on left side        Billy Fischer, MD 03/19/15 [id number]

## 2015-03-19 NOTE — ED Provider Notes (Signed)
CSN: [id number]     Arrival date & time 03/19/15  1609 History   First MD Initiated Contact with Patient 03/19/15 1621     Chief Complaint  Patient presents with  . Flank Pain     (Consider location/radiation/quality/duration/timing/severity/associated sxs/prior Treatment) HPI   Patient is a very pleasant 70 year old male presenting with flank pain radiating to his groin. Patient's past medical history significant for diabetes hypertension hyperlipidemia. Patient went to urgent care because of pain in his left groin. He thought he had a renal stone and sent him to follow up with Dr. Tenna Child. t at urology's office. He found patient to be ill-appearing and was unsure about whether patient acutally had a stone. There is no imaging. Patient sent here from the urologist office for further workup.  Past Medical History  Diagnosis Date  . Diabetes mellitus without complication [facility])    Past Surgical History  Procedure Laterality Date  . Hernia repair     History reviewed. No pertinent family history. Social History  Substance Use Topics  . Smoking status: Never Smoker   . Smokeless tobacco: None  . Alcohol Use: Yes     Comment: rarely    Review of Systems  Constitutional: Negative for activity change.  HENT: Negative for congestion.   Respiratory: Negative for shortness of breath.   Cardiovascular: Negative for chest pain.  Gastrointestinal: Positive for abdominal pain.  Genitourinary: Positive for flank pain.  Musculoskeletal: Negative for arthralgias.  Neurological: Negative for dizziness.  Psychiatric/Behavioral: Negative for agitation.      Allergies  Review of patient's allergies indicates no known allergies.  Home Medications   Prior to Admission medications   Medication Sig Start Date End Date Taking? Authorizing Provider  atorvastatin (LIPITOR) 10 MG tablet Take 10 mg by mouth daily.   Yes Historical Provider, MD  losartan (COZAAR) 25 MG tablet Take 25 mg by  mouth daily.   Yes Historical Provider, MD  sitaGLIPtin-metformin (JANUMET) 50-1000 MG tablet Take 1 tablet by mouth 2 (two) times daily with a meal.   Yes Historical Provider, MD   BP 120/79 mmHg  Pulse 85  Temp(Src) 97.9 F (36.6 C) (Oral)  Resp 18  SpO2 97% Physical Exam  Constitutional: He is oriented to person, place, and time. He appears well-nourished.  HENT:  Head: Normocephalic.  Mouth/Throat: Oropharynx is clear and moist.  Eyes: Conjunctivae are normal.  Neck: No tracheal deviation present.  Cardiovascular: Normal rate.   Pulmonary/Chest: Effort normal. No stridor. No respiratory distress.  Abdominal: Soft. There is no tenderness. There is no guarding.  No flank tenderness. No tenderness on abdominal exam.  Musculoskeletal: Normal range of motion. He exhibits no edema.  Neurological: He is oriented to person, place, and time. No cranial nerve deficit.  Skin: Skin is warm and dry. No rash noted. He is not diaphoretic.  Psychiatric: He has a normal mood and affect. His behavior is normal.  Nursing note and vitals reviewed.   ED Course  Procedures (including critical care time) Labs Review Labs Reviewed  URINALYSIS, ROUTINE W REFLEX MICROSCOPIC (NOT AT [facility]) - Abnormal; Notable for the following:    Hgb urine dipstick LARGE (*)    Bilirubin Urine SMALL (*)    Ketones, ur 15 (*)    All other components within normal limits  COMPREHENSIVE METABOLIC PANEL - Abnormal; Notable for the following:    Glucose, Bld 190 (*)    GFR calc non Af Amer 57 (*)    All other components  within normal limits  CBC WITH DIFFERENTIAL/PLATELET - Abnormal; Notable for the following:    WBC 12.9 (*)    Neutro Abs 11.5 (*)    All other components within normal limits  URINE MICROSCOPIC-ADD ON - Abnormal; Notable for the following:    Squamous Epithelial / LPF 0-5 (*)    Bacteria, UA FEW (*)    All other components within normal limits  URINE CULTURE    Imaging Review Ct Renal Stone  Study  03/19/2015  CLINICAL DATA:  Left flank pain. EXAM: CT ABDOMEN AND PELVIS WITHOUT CONTRAST TECHNIQUE: Multidetector CT imaging of the abdomen and pelvis was performed following the standard protocol without IV contrast. COMPARISON:  None FINDINGS: Lower chest: Calcification within the RCA, LAD and left circumflex coronary artery noted. No pleural effusion identified. The lung bases appear clear. Hepatobiliary: No suspicious liver abnormalities identified. The gallbladder appears normal. There is no biliary dilatation. Pancreas: Fluid attenuating structure arising from the head of pancreas measures 1.3 cm, image 38 of series 2. Calcification is noted within the tail of pancreas. Spleen: The spleen is negative. Adrenals/Urinary Tract: Normal adrenal glands. Normal appearance of the right kidney. There is asymmetric left-sided hydronephrosis and perinephric fat stranding. Left-sided hydroureter is identified. Within the distal ureter there is a 4 mm calculus, image number 81 of series 2. The urinary bladder appears normal. Stomach/Bowel: The stomach is within normal limits. The small bowel loops have a normal course and caliber. No obstruction. Normal appearance of the colon. Vascular/Lymphatic: Calcified atherosclerotic disease involves the abdominal aorta. No aneurysm. No enlarged retroperitoneal or mesenteric adenopathy. No enlarged pelvic or inguinal lymph nodes. Reproductive: Prostate gland and seminal vesicles appear normal. Other: There is no ascites or focal fluid collections within the abdomen or pelvis. Previous repair of right inguinal hernia. Musculoskeletal: Mild degenerative disc disease noted within the lumbar spine. No aggressive lytic or sclerotic bone lesion. IMPRESSION: 1. Distal left ureteral calculus measures 4 mm and results in left-sided hydronephrosis and hydroureter. 2. Aortic atherosclerosis and multi vessel coronary artery calcification 3. Small fluid attenuating structure within the  head of pancreas noted. This may represent a small cystic lesion of the pancreas or sequelae of prior pancreatitis. A followup examination in 6 - 12 months with pancreas protocol CT or MRI would be recommended. Electronically Signed   By: Kerby Moors M.D.   On: 03/19/2015 18:21   I have personally reviewed and evaluated these images and lab results as part of my medical decision-making.   EKG Interpretation None      MDM   Final diagnoses:  None    Patient is a 70 year old male presenting with leg pain today. Patient seen at urgent care and presumptively diagnosed with a stone and sent to urology's office. Urology sent him here for further imaging, lab work. Patient received Toradol prior to arrival. We will get CT stone study. UA. Labs to check for creatinine.  6:43 PM Patient's pain is been completely controlled since arrival. CT shows 4 mm stone. No urinary infection normal creatinine. No fevers here. Discussed with Dr. Vikki Ports from urology. Plan to discharge with follow-up precautions.    Khyla Mccumbers Julio Alm, MD 03/19/15 1843

## 2015-03-21 LAB — URINE CULTURE: Culture: 1000

## 2015-03-22 ENCOUNTER — Emergency Department (HOSPITAL_COMMUNITY): Payer: Medicare Other

## 2015-03-22 ENCOUNTER — Encounter (HOSPITAL_COMMUNITY): Payer: Self-pay | Admitting: *Deleted

## 2015-03-22 ENCOUNTER — Emergency Department (HOSPITAL_COMMUNITY)
Admission: EM | Admit: 2015-03-22 | Discharge: 2015-03-22 | Disposition: A | Discharge status: Home / Self Care | Payer: Medicare Other | Attending: Emergency Medicine | Admitting: Emergency Medicine

## 2015-03-22 ENCOUNTER — Encounter (HOSPITAL_COMMUNITY): Payer: Self-pay | Admitting: Emergency Medicine

## 2015-03-22 DIAGNOSIS — Y998 Other external cause status: Secondary | ICD-10-CM | POA: Diagnosis not present

## 2015-03-22 DIAGNOSIS — N529 Male erectile dysfunction, unspecified: Secondary | ICD-10-CM | POA: Insufficient documentation

## 2015-03-22 DIAGNOSIS — S0993XA Unspecified injury of face, initial encounter: Secondary | ICD-10-CM

## 2015-03-22 DIAGNOSIS — Z86010 Personal history of colonic polyps: Secondary | ICD-10-CM | POA: Diagnosis not present

## 2015-03-22 DIAGNOSIS — Z8719 Personal history of other diseases of the digestive system: Secondary | ICD-10-CM | POA: Insufficient documentation

## 2015-03-22 DIAGNOSIS — Z8739 Personal history of other diseases of the musculoskeletal system and connective tissue: Secondary | ICD-10-CM | POA: Diagnosis not present

## 2015-03-22 DIAGNOSIS — S0083XA Contusion of other part of head, initial encounter: Secondary | ICD-10-CM | POA: Diagnosis not present

## 2015-03-22 DIAGNOSIS — R55 Syncope and collapse: Secondary | ICD-10-CM | POA: Diagnosis not present

## 2015-03-22 DIAGNOSIS — Y9389 Activity, other specified: Secondary | ICD-10-CM | POA: Insufficient documentation

## 2015-03-22 DIAGNOSIS — R404 Transient alteration of awareness: Secondary | ICD-10-CM | POA: Diagnosis not present

## 2015-03-22 DIAGNOSIS — E785 Hyperlipidemia, unspecified: Secondary | ICD-10-CM | POA: Insufficient documentation

## 2015-03-22 DIAGNOSIS — W1839XA Other fall on same level, initial encounter: Secondary | ICD-10-CM | POA: Insufficient documentation

## 2015-03-22 DIAGNOSIS — Z79899 Other long term (current) drug therapy: Secondary | ICD-10-CM | POA: Insufficient documentation

## 2015-03-22 DIAGNOSIS — Z8781 Personal history of (healed) traumatic fracture: Secondary | ICD-10-CM | POA: Insufficient documentation

## 2015-03-22 DIAGNOSIS — S0011XA Contusion of right eyelid and periocular area, initial encounter: Secondary | ICD-10-CM | POA: Diagnosis not present

## 2015-03-22 DIAGNOSIS — Z7982 Long term (current) use of aspirin: Secondary | ICD-10-CM | POA: Diagnosis not present

## 2015-03-22 DIAGNOSIS — E119 Type 2 diabetes mellitus without complications: Secondary | ICD-10-CM | POA: Diagnosis not present

## 2015-03-22 DIAGNOSIS — Y9289 Other specified places as the place of occurrence of the external cause: Secondary | ICD-10-CM | POA: Diagnosis not present

## 2015-03-22 DIAGNOSIS — I10 Essential (primary) hypertension: Secondary | ICD-10-CM | POA: Diagnosis not present

## 2015-03-22 DIAGNOSIS — R220 Localized swelling, mass and lump, head: Secondary | ICD-10-CM | POA: Diagnosis not present

## 2015-03-22 DIAGNOSIS — Z7984 Long term (current) use of oral hypoglycemic drugs: Secondary | ICD-10-CM | POA: Diagnosis not present

## 2015-03-22 LAB — URINALYSIS, ROUTINE W REFLEX MICROSCOPIC
BILIRUBIN URINE: NEGATIVE
Glucose, UA: NEGATIVE mg/dL
KETONES UR: NEGATIVE mg/dL
Leukocytes, UA: NEGATIVE
NITRITE: NEGATIVE
PROTEIN: NEGATIVE mg/dL
SPECIFIC GRAVITY, URINE: 1.024 (ref 1.005–1.030)
pH: 5 (ref 5.0–8.0)

## 2015-03-22 LAB — I-STAT CHEM 8, ED
BUN: 31 mg/dL — AB (ref 6–20)
CHLORIDE: 103 mmol/L (ref 101–111)
CREATININE: 1 mg/dL (ref 0.61–1.24)
Calcium, Ion: 1.2 mmol/L (ref 1.13–1.30)
GLUCOSE: 116 mg/dL — AB (ref 65–99)
HEMATOCRIT: 41 % (ref 39.0–52.0)
Hemoglobin: 13.9 g/dL (ref 13.0–17.0)
POTASSIUM: 4.7 mmol/L (ref 3.5–5.1)
Sodium: 139 mmol/L (ref 135–145)
TCO2: 24 mmol/L (ref 0–100)

## 2015-03-22 LAB — URINE MICROSCOPIC-ADD ON: SQUAMOUS EPITHELIAL / LPF: NONE SEEN

## 2015-03-22 MED ORDER — IBUPROFEN 800 MG PO TABS
800.0000 mg | ORAL_TABLET | Freq: Once | ORAL | Status: AC
  Administered 2015-03-22: 800 mg via ORAL
  Filled 2015-03-22: qty 1

## 2015-03-22 MED ORDER — SODIUM CHLORIDE 0.9 % IV BOLUS (SEPSIS)
1000.0000 mL | Freq: Once | INTRAVENOUS | Status: AC
  Administered 2015-03-22: 1000 mL via INTRAVENOUS

## 2015-03-22 NOTE — ED Provider Notes (Signed)
CSN: [id number]     Arrival date & time 03/22/15  1317 History   First MD Initiated Contact with Patient 03/22/15 1344     Chief Complaint  Patient presents with  . Loss of Consciousness  . Fall     HPI Patient presents to the emergency department after a syncopal episode today while having his car worked on at [employer].  He was recently seen several days ago and diagnosed with an acute ureteral stone and placed on oxycodone and Flomax.  He is on losartan as well.  His been compliant with his medications.  He states from a renal colic standpoint his symptoms have completely resolved.  Through the weekend he was without any symptoms.  He did have breakfast this morning.  He was at the dealership for several hours when he initially started up he was lightheaded and he sat back down partially 1 hour later he stood backup became lightheaded and the next thing he knew he was on the ground.  No preceding chest pain or palpitations.  He struck his face and presents to the emergency department with swelling of his right maxillary sinus region and right paravertebral region as well as his left periorbital region.  He is on aspirin only.  He is not on any stronger anticoagulants.  He denies neck pain.  He reports no weakness in his arms or legs.  No chest pain or shortness of breath at this time.  Denies abdominal pain.  Reports no black or bloody stools.  No seizure activity was noted.    Past Medical History  Diagnosis Date  . Diabetes mellitus   . Hyperlipidemia   . Hypertension   . Cervical radiculopathy   . Cervical disc disease   . Erectile dysfunction   . Carotid stenosis   . Sciatica 07/17/2011  . Polyp of colon, adenomatous 10/27/2011    August 2013 - for f/u colonoscopy at 3 yrs  . Pelvic fracture ([place])   . Hepatic steatosis   . Diabetes mellitus without complication [facility])    Past Surgical History  Procedure Laterality Date  . Inguinal hernia repair Bilateral 1990    left x 2,right  1  . Tonsillectomy  1949  . Bone spur  2012    cervical bone spur removal  . Hernia repair     Family History  Problem Relation Age of Onset  . Heart disease Mother   . Stroke Mother   . Heart disease Father   . Colon cancer Neg Hx   . Cancer Mother     type unknown  . Dementia Mother    Social History  Substance Use Topics  . Smoking status: Never Smoker   . Smokeless tobacco: None  . Alcohol Use: Yes     Comment: 4 per month    Review of Systems  All other systems reviewed and are negative.     Allergies  Viagra  Home Medications   Prior to Admission medications   Medication Sig Start Date End Date Taking? Authorizing Provider  aspirin 81 MG tablet Take 81 mg by mouth daily.   Yes Historical Provider, MD  atorvastatin (LIPITOR) 10 MG tablet Take 1 tablet (10 mg total) by mouth daily. 03/27/13  Yes Biagio Borg, MD  diphenhydrAMINE (SOMINEX) 25 MG tablet Take 25-50 mg by mouth daily as needed for allergies.   Yes Historical Provider, MD  losartan (COZAAR) 25 MG tablet Take 25 mg by mouth daily.   Yes Historical  Provider, MD  ondansetron (ZOFRAN) 4 MG tablet Take 1 tablet (4 mg total) by mouth every 8 (eight) hours as needed for nausea or vomiting. 03/19/15  Yes Courteney Lyn Mackuen, MD  oxyCODONE-acetaminophen (PERCOCET/ROXICET) 5-325 MG tablet Take 1 tablet by mouth every 6 (six) hours as needed for severe pain. 03/19/15  Yes Courteney Lyn Mackuen, MD  sitaGLIPtan-metformin (JANUMET) 50-1000 MG per tablet Take 1 tablet by mouth 2 (two) times daily with a meal. 09/26/12 03/22/15 Yes Biagio Borg, MD  tadalafil (CIALIS) 20 MG tablet Take 1 tablet (20 mg total) by mouth daily as needed for erectile dysfunction. 6 monthly 09/26/12  Yes Biagio Borg, MD  glucose blood (ONE TOUCH ULTRA TEST) test strip Use as directed twice daily as directed.  Diagnosis code 250.02 09/26/12   Biagio Borg, MD   BP 138/77 mmHg  Pulse 80  Temp(Src) 97.7 F (36.5 C) (Oral)  Resp 20  SpO2  96% Physical Exam  Constitutional: He is oriented to person, place, and time. He appears well-developed and well-nourished.  HENT:  Head: Normocephalic.  Patient with a right periorbital ecchymosis and ecchymosis over his right maxillary sinus with tenderness overlying his right maxillary sinus.  Nasal bridge is midline.  Mild swelling of his left periorbital rim.  Extra movements are intact.  No trismus or malocclusion.  Dentition is grossly intact.  Eyes: EOM are normal.  Neck: Normal range of motion. Neck supple.  C-spine nontender.  Cardiovascular: Normal rate, regular rhythm and normal heart sounds.   Pulmonary/Chest: Effort normal and breath sounds normal. No respiratory distress.  Abdominal: Soft. He exhibits no distension. There is no tenderness.  Musculoskeletal: Normal range of motion.  Full range of motion bilateral hips and knees.  Neurological: He is alert and oriented to person, place, and time.  Skin: Skin is warm and dry.  Psychiatric: He has a normal mood and affect. Judgment normal.  Nursing note and vitals reviewed.   ED Course  Procedures (including critical care time) Labs Review Labs Reviewed  URINALYSIS, ROUTINE W REFLEX MICROSCOPIC (NOT AT [facility]) - Abnormal; Notable for the following:    Hgb urine dipstick MODERATE (*)    All other components within normal limits  URINE MICROSCOPIC-ADD ON - Abnormal; Notable for the following:    Bacteria, UA RARE (*)    Crystals URIC ACID CRYSTALS (*)    All other components within normal limits  I-STAT CHEM 8, ED - Abnormal; Notable for the following:    BUN 31 (*)    Glucose, Bld 116 (*)    All other components within normal limits  I-STAT CHEM 8, ED    Imaging Review Ct Head Wo Contrast  03/22/2015  CLINICAL DATA:  Syncopal episode with fall. Right periorbital soft tissue swelling. EXAM: CT HEAD WITHOUT CONTRAST CT MAXILLOFACIAL WITHOUT CONTRAST TECHNIQUE: Multidetector CT imaging of the head and maxillofacial  structures were performed using the standard protocol without intravenous contrast. Multiplanar CT image reconstructions of the maxillofacial structures were also generated. COMPARISON:  None. FINDINGS: CT HEAD FINDINGS There is no evidence of acute intracranial hemorrhage, mass lesion, brain edema or acute extra-axial fluid collection. There is prominent low-density extra-axial fluid adjacent to both frontal lobes. There are mild small vessel ischemic changes within the periventricular white matter. The calvarium is intact. CT MAXILLOFACIAL FINDINGS There is multifocal soft tissue swelling in the right face involving the infraorbital and premalar soft tissues. There is minimal supraorbital soft tissue swelling. No evidence of orbital hematoma  or globe injury. The optic nerves and extraocular muscles are intact. No sinus air-fluid levels are identified. There is mucosal thickening in the right frontal, maxillary and ethmoid sinuses. The mastoid air cells and middle ears are clear. No evidence of acute maxillofacial fracture. Postsurgical changes are noted status post C5-6 ACDF. IMPRESSION: 1. Right facial soft tissue injury as described. No evidence of postseptal orbital hematoma. 2. No evidence of acute maxillofacial fracture. Right-sided mucosal thickening noted. 3. No acute intracranial findings. Electronically Signed   By: Richardean Sale M.D.   On: 03/22/2015 15:06   Ct Maxillofacial Wo Cm  03/22/2015  CLINICAL DATA:  Syncopal episode with fall. Right periorbital soft tissue swelling. EXAM: CT HEAD WITHOUT CONTRAST CT MAXILLOFACIAL WITHOUT CONTRAST TECHNIQUE: Multidetector CT imaging of the head and maxillofacial structures were performed using the standard protocol without intravenous contrast. Multiplanar CT image reconstructions of the maxillofacial structures were also generated. COMPARISON:  None. FINDINGS: CT HEAD FINDINGS There is no evidence of acute intracranial hemorrhage, mass lesion, brain edema  or acute extra-axial fluid collection. There is prominent low-density extra-axial fluid adjacent to both frontal lobes. There are mild small vessel ischemic changes within the periventricular white matter. The calvarium is intact. CT MAXILLOFACIAL FINDINGS There is multifocal soft tissue swelling in the right face involving the infraorbital and premalar soft tissues. There is minimal supraorbital soft tissue swelling. No evidence of orbital hematoma or globe injury. The optic nerves and extraocular muscles are intact. No sinus air-fluid levels are identified. There is mucosal thickening in the right frontal, maxillary and ethmoid sinuses. The mastoid air cells and middle ears are clear. No evidence of acute maxillofacial fracture. Postsurgical changes are noted status post C5-6 ACDF. IMPRESSION: 1. Right facial soft tissue injury as described. No evidence of postseptal orbital hematoma. 2. No evidence of acute maxillofacial fracture. Right-sided mucosal thickening noted. 3. No acute intracranial findings. Electronically Signed   By: Richardean Sale M.D.   On: 03/22/2015 15:06   I have personally reviewed and evaluated these images and lab results as part of my medical decision-making.   EKG Interpretation None      MDM   Final diagnoses:  Syncope, unspecified syncope type  Facial injury, initial encounter    Imaging of the head and face is without acute fracture.  Contusion only.  C-spine cleared by Nexus criteria.  Vital signs are normal.  Patient is able to stand at the side the bed without any difficulty.  I suspect that his Flomax was playing an issue in this.  He has a symptomatic from a renal colic standpoint.  His Flomax will be discontinued at this time.  Have encouraged ongoing oral hydration at home.  Primary care follow-up.  He understands to return to the ER for new or worsening symptoms    Jola Schmidt, MD 03/22/15 [id number]

## 2015-03-22 NOTE — ED Notes (Signed)
Unsuccessful attempt for blood draw. Main lab called to attempt blood draw.

## 2015-03-22 NOTE — Discharge Instructions (Signed)

## 2015-03-22 NOTE — ED Notes (Signed)
Pt had a syncopal episode prior to arrival with EMS. Pt did have some lightheadedness an hour before the fall. Pt sat down for a while and when he stood up, he had a syncopal episode. Bystanders report pt had LOC for a couple minutes with spontaneous recovery. Pt has a hematoma and swelling under R eye. Otherwise denies any pain. VS and EKG good with EMS.

## 2015-03-22 NOTE — ED Notes (Signed)
Bed: WA03 Expected date:  Expected time:  Means of arrival:  Comments: EMS- 70yo M, syncope/fall

## 2015-03-23 ENCOUNTER — Emergency Department (HOSPITAL_COMMUNITY)
Admission: EM | Admit: 2015-03-23 | Discharge: 2015-03-23 | Disposition: A | Discharge status: Home / Self Care | Payer: Medicare Other | Attending: Emergency Medicine | Admitting: Emergency Medicine

## 2015-03-23 ENCOUNTER — Encounter (HOSPITAL_COMMUNITY): Payer: Self-pay | Admitting: Emergency Medicine

## 2015-03-23 DIAGNOSIS — N529 Male erectile dysfunction, unspecified: Secondary | ICD-10-CM | POA: Insufficient documentation

## 2015-03-23 DIAGNOSIS — E785 Hyperlipidemia, unspecified: Secondary | ICD-10-CM | POA: Diagnosis not present

## 2015-03-23 DIAGNOSIS — I10 Essential (primary) hypertension: Secondary | ICD-10-CM | POA: Insufficient documentation

## 2015-03-23 DIAGNOSIS — Z79899 Other long term (current) drug therapy: Secondary | ICD-10-CM | POA: Insufficient documentation

## 2015-03-23 DIAGNOSIS — E119 Type 2 diabetes mellitus without complications: Secondary | ICD-10-CM | POA: Diagnosis not present

## 2015-03-23 DIAGNOSIS — Z8781 Personal history of (healed) traumatic fracture: Secondary | ICD-10-CM | POA: Insufficient documentation

## 2015-03-23 DIAGNOSIS — Z7982 Long term (current) use of aspirin: Secondary | ICD-10-CM | POA: Insufficient documentation

## 2015-03-23 DIAGNOSIS — R109 Unspecified abdominal pain: Secondary | ICD-10-CM | POA: Diagnosis present

## 2015-03-23 DIAGNOSIS — Z86010 Personal history of colonic polyps: Secondary | ICD-10-CM | POA: Insufficient documentation

## 2015-03-23 DIAGNOSIS — Z8739 Personal history of other diseases of the musculoskeletal system and connective tissue: Secondary | ICD-10-CM | POA: Insufficient documentation

## 2015-03-23 DIAGNOSIS — N23 Unspecified renal colic: Secondary | ICD-10-CM | POA: Diagnosis not present

## 2015-03-23 DIAGNOSIS — Z8719 Personal history of other diseases of the digestive system: Secondary | ICD-10-CM | POA: Diagnosis not present

## 2015-03-23 LAB — URINALYSIS, ROUTINE W REFLEX MICROSCOPIC
Bilirubin Urine: NEGATIVE
Glucose, UA: 100 mg/dL — AB
Ketones, ur: NEGATIVE mg/dL
LEUKOCYTES UA: NEGATIVE
Nitrite: NEGATIVE
PROTEIN: NEGATIVE mg/dL
SPECIFIC GRAVITY, URINE: 1.028 (ref 1.005–1.030)
pH: 5.5 (ref 5.0–8.0)

## 2015-03-23 LAB — URINE MICROSCOPIC-ADD ON: BACTERIA UA: NONE SEEN

## 2015-03-23 MED ORDER — ONDANSETRON HCL 4 MG/2ML IJ SOLN
4.0000 mg | Freq: Once | INTRAMUSCULAR | Status: AC
  Administered 2015-03-23: 4 mg via INTRAVENOUS
  Filled 2015-03-23: qty 2

## 2015-03-23 MED ORDER — KETOROLAC TROMETHAMINE 30 MG/ML IJ SOLN
30.0000 mg | Freq: Once | INTRAMUSCULAR | Status: AC
  Administered 2015-03-23: 30 mg via INTRAVENOUS
  Filled 2015-03-23: qty 1

## 2015-03-23 MED ORDER — HYDROMORPHONE HCL 1 MG/ML IJ SOLN
1.0000 mg | Freq: Once | INTRAMUSCULAR | Status: AC
  Administered 2015-03-23: 1 mg via INTRAVENOUS
  Filled 2015-03-23: qty 1

## 2015-03-23 MED ORDER — HYDROCODONE-ACETAMINOPHEN 5-325 MG PO TABS: 1.0000 | ORAL_TABLET | ORAL | Status: DC | PRN

## 2015-03-23 NOTE — ED Notes (Signed)
Pt states that he was seen on the 3rd for flank pain and was dx with 21mm kidney stone (lt side).  Pt states that he was put on flomax and passed out yesterday.  Was seen here after passing out and cleared to go home.  Today pt states that the pain is unbearable.  Pt is having LLQ pain.  States that percocet is not working.

## 2015-03-23 NOTE — ED Notes (Signed)
Made first request for urine pt states he is unable to provide one at this time

## 2015-03-23 NOTE — ED Notes (Signed)
Pain with certain movement

## 2015-03-23 NOTE — ED Provider Notes (Signed)
CSN: [id number]     Arrival date & time 03/23/15  1027 History   First MD Initiated Contact with Patient 03/23/15 1148     Chief Complaint  Patient presents with  . Flank Pain     HPI Patient has a known history of a distal left ureteral stone.  He presents after developing acute recurrent left-sided abdominal pain with radiation to his left groin this morning.  Made him nauseated.  He initially had been placed on pain medication of Flomax but I saw the patient emergency department yesterday after he had a syncopal episode and fell for which was suspected to be secondary to mild volume depletion on Flomax.  He has since stopped his Flomax.  Today he states that he is not lightheaded feeling much better from a syncopal and trauma standpoint from yesterday but his pain in his left abdomen is returned reminiscent of his prior renal colic.  He has seen urology once but has not followed back up with them.  He's been managing his ureteral colic at home with pain medication which he states today was not helping.  His original ureteral stone was noted on 03/19/2015.  His pain is severe in severity at this time.   Past Medical History  Diagnosis Date  . Diabetes mellitus   . Hyperlipidemia   . Hypertension   . Cervical radiculopathy   . Cervical disc disease   . Erectile dysfunction   . Carotid stenosis   . Sciatica 07/17/2011  . Polyp of colon, adenomatous 10/27/2011    August 2013 - for f/u colonoscopy at 3 yrs  . Pelvic fracture ([place])   . Hepatic steatosis   . Diabetes mellitus without complication [facility])    Past Surgical History  Procedure Laterality Date  . Inguinal hernia repair Bilateral 1990    left x 2,right 1  . Tonsillectomy  1949  . Bone spur  2012    cervical bone spur removal  . Hernia repair     Family History  Problem Relation Age of Onset  . Heart disease Mother   . Stroke Mother   . Heart disease Father   . Colon cancer Neg Hx   . Cancer Mother     type unknown  .  Dementia Mother    Social History  Substance Use Topics  . Smoking status: Never Smoker   . Smokeless tobacco: None  . Alcohol Use: Yes     Comment: 4 per month    Review of Systems  All other systems reviewed and are negative.     Allergies  Viagra  Home Medications   Prior to Admission medications   Medication Sig Start Date End Date Taking? Authorizing Provider  aspirin 81 MG tablet Take 81 mg by mouth daily.   Yes Historical Provider, MD  atorvastatin (LIPITOR) 10 MG tablet Take 1 tablet (10 mg total) by mouth daily. 03/27/13  Yes Biagio Borg, MD  diphenhydrAMINE (SOMINEX) 25 MG tablet Take 25-50 mg by mouth daily as needed for allergies.   Yes Historical Provider, MD  losartan (COZAAR) 25 MG tablet Take 25 mg by mouth daily.   Yes Historical Provider, MD  ondansetron (ZOFRAN) 4 MG tablet Take 1 tablet (4 mg total) by mouth every 8 (eight) hours as needed for nausea or vomiting. 03/19/15  Yes Courteney Lyn Mackuen, MD  sitaGLIPtan-metformin (JANUMET) 50-1000 MG per tablet Take 1 tablet by mouth 2 (two) times daily with a meal. 09/26/12 03/23/15 Yes Biagio Borg,  MD  tadalafil (CIALIS) 20 MG tablet Take 1 tablet (20 mg total) by mouth daily as needed for erectile dysfunction. 6 monthly 09/26/12  Yes Biagio Borg, MD  tamsulosin (FLOMAX) 0.4 MG CAPS capsule Take 0.4 mg by mouth daily. 03/19/15  Yes Historical Provider, MD  glucose blood (ONE TOUCH ULTRA TEST) test strip Use as directed twice daily as directed.  Diagnosis code 250.02 09/26/12   Biagio Borg, MD  HYDROcodone-acetaminophen (NORCO/VICODIN) 5-325 MG tablet Take 1 tablet by mouth every 4 (four) hours as needed for moderate pain. 03/23/15   Jola Schmidt, MD   BP 133/84 mmHg  Pulse 73  Temp(Src) 98.3 F (36.8 C) (Oral)  Resp 16  SpO2 98% Physical Exam  Constitutional: He is oriented to person, place, and time. He appears well-developed and well-nourished.  HENT:  Head: Normocephalic and atraumatic.  Eyes: EOM are normal.   Neck: Normal range of motion.  Cardiovascular: Normal rate, regular rhythm, normal heart sounds and intact distal pulses.   Pulmonary/Chest: Effort normal and breath sounds normal. No respiratory distress.  Abdominal: Soft. He exhibits no distension. There is no tenderness.  Musculoskeletal: Normal range of motion.  Neurological: He is alert and oriented to person, place, and time.  Skin: Skin is warm and dry.  Psychiatric: He has a normal mood and affect. Judgment normal.  Nursing note and vitals reviewed.   ED Course  Procedures (including critical care time)  Angiocath insertion Performed by: Hoy Morn Consent: Verbal consent obtaine. Risks and benefits: risks, benefits and alternatives were discussed Time out: Immediately prior to procedure a "time out" was called to verify the correct patient, procedure, equipment, support staff and site/side marked as required. Preparation: Patient was prepped and draped in the usual sterile fashion. Vein Location: left AC Ultrasound Guided Gauge: 20 Normal blood return and flush without difficulty Patient tolerance: Patient tolerated the procedure well with no immediate complications.     Labs Review Labs Reviewed  URINALYSIS, ROUTINE W REFLEX MICROSCOPIC (NOT AT [facility]) - Abnormal; Notable for the following:    Glucose, UA 100 (*)    Hgb urine dipstick MODERATE (*)    All other components within normal limits  URINE MICROSCOPIC-ADD ON - Abnormal; Notable for the following:    Squamous Epithelial / LPF 0-5 (*)    All other components within normal limits    Imaging Review  I have personally reviewed and evaluated these images and lab results as part of my medical decision-making.   EKG Interpretation None      MDM   Final diagnoses:  Ureteral colic    Patient feels much better after symptomatic control emergency department.  His vital signs are without significant abnormality.  His urine shows no signs of infection.   Patient's been encouraged to follow-up with urology as an outpatient.  He understands return the emergency department for new or worsening symptoms    Jola Schmidt, MD 03/23/15 1635

## 2015-03-25 DIAGNOSIS — N201 Calculus of ureter: Secondary | ICD-10-CM | POA: Diagnosis not present

## 2015-03-25 DIAGNOSIS — Z0000 Encounter for general adult medical examination without abnormal findings: Secondary | ICD-10-CM | POA: Diagnosis not present

## 2015-03-31 DIAGNOSIS — E1149 Type 2 diabetes mellitus with other diabetic neurological complication: Secondary | ICD-10-CM | POA: Diagnosis not present

## 2015-04-01 DIAGNOSIS — N201 Calculus of ureter: Secondary | ICD-10-CM | POA: Diagnosis not present

## 2015-04-02 DIAGNOSIS — E7800 Pure hypercholesterolemia, unspecified: Secondary | ICD-10-CM | POA: Diagnosis not present

## 2015-04-02 DIAGNOSIS — E1149 Type 2 diabetes mellitus with other diabetic neurological complication: Secondary | ICD-10-CM | POA: Diagnosis not present

## 2015-04-02 DIAGNOSIS — N200 Calculus of kidney: Secondary | ICD-10-CM | POA: Diagnosis not present

## 2015-04-06 DIAGNOSIS — N201 Calculus of ureter: Secondary | ICD-10-CM | POA: Diagnosis not present

## 2015-04-06 DIAGNOSIS — Z0000 Encounter for general adult medical examination without abnormal findings: Secondary | ICD-10-CM | POA: Diagnosis not present

## 2015-04-06 DIAGNOSIS — N23 Unspecified renal colic: Secondary | ICD-10-CM | POA: Diagnosis not present

## 2015-04-22 DIAGNOSIS — E119 Type 2 diabetes mellitus without complications: Secondary | ICD-10-CM | POA: Diagnosis not present

## 2015-04-27 DIAGNOSIS — E119 Type 2 diabetes mellitus without complications: Secondary | ICD-10-CM | POA: Diagnosis not present

## 2015-05-04 DIAGNOSIS — N201 Calculus of ureter: Secondary | ICD-10-CM | POA: Diagnosis not present

## 2015-05-04 DIAGNOSIS — Z0000 Encounter for general adult medical examination without abnormal findings: Secondary | ICD-10-CM | POA: Diagnosis not present

## 2015-05-06 DIAGNOSIS — E119 Type 2 diabetes mellitus without complications: Secondary | ICD-10-CM | POA: Diagnosis not present

## 2015-05-11 DIAGNOSIS — N200 Calculus of kidney: Secondary | ICD-10-CM | POA: Diagnosis not present

## 2015-05-12 DIAGNOSIS — N200 Calculus of kidney: Secondary | ICD-10-CM | POA: Diagnosis not present

## 2015-06-16 DIAGNOSIS — Z0000 Encounter for general adult medical examination without abnormal findings: Secondary | ICD-10-CM | POA: Diagnosis not present

## 2015-06-16 DIAGNOSIS — N200 Calculus of kidney: Secondary | ICD-10-CM | POA: Diagnosis not present

## 2015-06-18 ENCOUNTER — Encounter: Payer: Medicare Other | Attending: Internal Medicine | Admitting: Dietician

## 2015-06-18 ENCOUNTER — Encounter: Payer: Self-pay | Admitting: Dietician

## 2015-06-18 VITALS — Ht 70.0 in | Wt 155.0 lb

## 2015-06-18 DIAGNOSIS — E119 Type 2 diabetes mellitus without complications: Secondary | ICD-10-CM | POA: Diagnosis not present

## 2015-06-18 DIAGNOSIS — E7800 Pure hypercholesterolemia, unspecified: Secondary | ICD-10-CM | POA: Diagnosis not present

## 2015-06-18 NOTE — Patient Instructions (Addendum)
Recommend to limit beverages with added sugar.  (Consider lemon juice in water and sweeten with stevia or other non caloric sweetener.) Increase your intake of non starchy vegetables as able. Drink plenty of fluid aiming for more than 2 Liters (8 cups) per day. Spread carbohydrate throughout the day.  Include snacks 2-3 times per day with small amounts of protein to work towards weight gain. Add avocado, olive oil, nuts to increase caloric intake without adding additional carbohydrates. Aim for 5 Carb Choices per meal (75 grams) +/- 1 either way  Aim for 0-2 Carbs per snack if hungry  Include protein in moderation with your meals and snacks Consider reading food labels for Total Carbohydrate and Fat Grams of foods Continue your active lifestyle! Continue checking BG at alternate times per day as directed by MD  Discuss your medication with your doctor.

## 2015-06-18 NOTE — Progress Notes (Signed)
Medical Nutrition Therapy:  Appt start time: 1430 end time:  [id number].   Assessment:  Primary concerns today: Patient is her today alone.  He would like more information related to gaining weight and accommodating his dietary needs due to type 2 diabetes, hypercholesterolemia, and kidney stones. He had kidney stones in February and reports that he needs to decrease uric acid and increase urine citrate.  Hx includes type 2 diabetes for the past 20 years with the most recent HgbA1C of 6.4%.  He has more recently been drinking large amounts of regular lemonade due to recommendations related to the kidney stones.  Other hx includes HTN and hyperlipidemia.  He has not had any nutrition education from a RD or CDE in the past.  Weight hx: Today 155 lbs.   Weight loss of 25-30 lbs since the age of 52. Weight loss of 8 lbs while on Janumet.  This was changed to Zig Duo.  He lives with his wife.  They share cooking and shopping responsibilities.  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  They moved from [place] about 5 years ago to be closer to his family.    Preferred Learning Style:   No preference indicated   Learning Readiness:   Ready  MEDICATIONS: see list to include zig duo xi, glipizide XR, potassium citrate, 2 tums and sodium bicarb at times.   DIETARY INTAKE:  24-hr recall:  B (8:30 AM): steel cut oats, 2 slices [employer] toast, regular lemonade  Snk ( AM): none  L (12 PM): egg salad sandwich, sun chips, Archway oatmeal raisin cookie, diet cran grape juice Snk ( PM): peanuts, cashews,  D (6:30 PM): grilled lean pork chops (4-5 ounce) with herbs, sweet potatoes, carrots Snk (8-9 PM): strawberries Beverages: regular lemonade, water, occasional black coffee, diet cran grape juice, carbonated water  Usual physical activity: gardens (6-7 hours per day), Went to [employer] at the beginning of the year but they did not follow  through regarding personal training.  Estimated energy needs: 2000 calories 225 g carbohydrates 125 g protein 67 g fat  Progress Towards Goal(s):  In progress.   Nutritional Diagnosis:  NB-1.1 Food and nutrition-related knowledge deficit As related to balance of carbohydrate, protein, and fat.  As evidenced by diet hx.    Intervention:  Nutrition counseling/education related to nutrition recommendations for type 2 diabetes, hypercholesterolemia, kidney stones, and weight gain.    Recommend to limit beverages with added sugar.  (Consider lemon juice in water and sweeten with stevia or other non caloric sweetener.) Increase your intake of non starchy vegetables as able. Drink plenty of fluid aiming for more than 2 Liters (8 cups) per day. Spread carbohydrate throughout the day.  Include snacks 2-3 times per day with small amounts of protein to work towards weight gain. Add avocado, olive oil, nuts to increase caloric intake without adding additional carbohydrates. Aim for 5 Carb Choices per meal (75 grams) +/- 1 either way  Aim for 0-2 Carbs per snack if hungry  Include protein in moderation with your meals and snacks Consider reading food labels for Total Carbohydrate and Fat Grams of foods Continue your active lifestyle! Continue checking BG at alternate times per day as directed by MD  Discuss your medication with your doctor.    Teaching Method Utilized:  Visual Auditory Hands on  Handouts given during visit include:  Kidney stone nutrition therapy from [facility]  Well with Diabetes Meal Plan Card My plate  Barriers to learning/adherence to lifestyle change: none  Demonstrated degree of understanding via:  Teach Back   Monitoring/Evaluation:  Dietary intake, exercise, and body weight in 3 week(s).

## 2015-06-30 DIAGNOSIS — E119 Type 2 diabetes mellitus without complications: Secondary | ICD-10-CM | POA: Diagnosis not present

## 2015-06-30 DIAGNOSIS — R6882 Decreased libido: Secondary | ICD-10-CM | POA: Diagnosis not present

## 2015-07-02 ENCOUNTER — Ambulatory Visit: Payer: Self-pay | Admitting: Dietician

## 2015-07-08 ENCOUNTER — Encounter: Payer: Medicare Other | Admitting: Dietician

## 2015-07-08 ENCOUNTER — Encounter: Payer: Self-pay | Admitting: Dietician

## 2015-07-08 VITALS — Wt 156.0 lb

## 2015-07-08 DIAGNOSIS — E7800 Pure hypercholesterolemia, unspecified: Secondary | ICD-10-CM | POA: Diagnosis not present

## 2015-07-08 DIAGNOSIS — E119 Type 2 diabetes mellitus without complications: Secondary | ICD-10-CM

## 2015-07-08 NOTE — Progress Notes (Signed)
Medical Nutrition Therapy:  Appt start time: 1115 end time:  1130.   Assessment:  06/18/15 Primary concerns today: Patient is her today alone.  He would like more information related to gaining weight and accommodating his dietary needs due to type 2 diabetes, hypercholesterolemia, and kidney stones. He had kidney stones in February and reports that he needs to decrease uric acid and increase urine citrate.  Hx includes type 2 diabetes for the past 20 years with the most recent HgbA1C of 6.4%.  He has more recently been drinking large amounts of regular lemonade due to recommendations related to the kidney stones.  Other hx includes HTN and hyperlipidemia.  He has not had any nutrition education from a RD or CDE in the past.  Weight hx: Today 155 lbs.   Weight loss of 25-30 lbs since the age of 16. Weight loss of 8 lbs while on Janumet.  This was changed to Zig Duo.  He lives with his wife.  They share cooking and shopping responsibilities.  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  They moved from [place] about 5 years ago to be closer to his family.    07/08/15: Patient is here alone.  His weight is 156 lbs.  Meds changed.  ZigDuo changed back to Janumet and he is taking the Glipizide XR.  His blood sugars have now been less than 100 most of the time.  He is eating better and not having the diarrhea.  He remains concerned about kidney stone prevention.  He brought several labels in to look at all of which can be fine in small amounts as part of a balanced diet.  He remains mindful about his food choices.  He reports an A1C of 5.8% 06/24/15.  He states that he had been restricting his diet due to fear about his blood sugar and with such good readings he has relaxed a little but still states that his intake is small compared to many.  He would like to gain to 165 lbs.  His UBW was 170 lbs for years.  His blood sugar meter is not  working consistently at this time.   Preferred Learning Style:   No preference indicated   Learning Readiness:   Ready  MEDICATIONS: see list to include zig duo xi, glipizide XR, potassium citrate, 2 tums and sodium bicarb at times.   DIETARY INTAKE:  24-hr recall: 06/18/15 visit B (8:30 AM): steel cut oats, 2 slices [employer] toast, regular lemonade  Snk ( AM): none  L (12 PM): egg salad sandwich, sun chips, [employer] oatmeal raisin cookie, diet cran grape juice Snk ( PM): peanuts, cashews,  D (6:30 PM): grilled lean pork chops (4-5 ounce) with herbs, sweet potatoes, carrots Snk (8-9 PM): strawberries Beverages: regular lemonade, water, occasional black coffee, diet cran grape juice, carbonated water  Usual physical activity: gardens (6-7 hours per day), Went to [employer] at the beginning of the year but they did not follow through regarding personal training.  Estimated energy needs: 2000 calories 225 g carbohydrates 125 g protein 67 g fat  Progress Towards Goal(s):  In progress.   Nutritional Diagnosis:  NB-1.1 Food and nutrition-related knowledge deficit As related to balance of carbohydrate, protein, and fat.  As evidenced by diet hx.    Intervention:  Nutrition counseling/education related to nutrition recommendations for type 2 diabetes, hypercholesterolemia, kidney stones, and weight gain continued.  Discussed label reading  further as well as types of fat.  Provided patient with a meter One Touch Verio (LOT:  [id number] x expiration 11/17). Patient to continue with mindful eating as discussed at last visit.  Recommend 5/5 to limit beverages with added sugar.  (Consider lemon juice in water and sweeten with stevia or other non caloric sweetener.) Increase your intake of non starchy vegetables as able. Drink plenty of fluid aiming for more than 2 Liters (8 cups) per day. Spread carbohydrate throughout the day.  Include snacks 2-3 times per day with small amounts of protein to work  towards weight gain. Add avocado, olive oil, nuts to increase caloric intake without adding additional carbohydrates. Aim for 5 Carb Choices per meal (75 grams) +/- 1 either way  Aim for 0-2 Carbs per snack if hungry  Include protein in moderation with your meals and snacks Consider reading food labels for Total Carbohydrate and Fat Grams of foods Continue your active lifestyle! Continue checking BG at alternate times per day as directed by MD  Discuss your medication with your doctor.    Teaching Method Utilized:  Visual Auditory Hands on  Handouts given during visit include:  Kidney stone nutrition therapy from [facility] Living Well with Diabetes Meal Plan Card My plate  Barriers to learning/adherence to lifestyle change: none  Demonstrated degree of understanding via:  Teach Back   Monitoring/Evaluation:  Dietary intake, exercise, and body weight prn.

## 2015-09-23 DIAGNOSIS — N200 Calculus of kidney: Secondary | ICD-10-CM | POA: Diagnosis not present

## 2015-09-27 DIAGNOSIS — E119 Type 2 diabetes mellitus without complications: Secondary | ICD-10-CM | POA: Diagnosis not present

## 2015-10-25 ENCOUNTER — Ambulatory Visit
Admission: RE | Admit: 2015-10-25 | Discharge: 2015-10-25 | Disposition: A | Discharge status: Home / Self Care | Payer: Medicare Other | Source: Ambulatory Visit | Attending: Chiropractic Medicine | Admitting: Chiropractic Medicine

## 2015-10-25 ENCOUNTER — Other Ambulatory Visit: Payer: Self-pay | Admitting: Chiropractic Medicine

## 2015-10-25 DIAGNOSIS — M25612 Stiffness of left shoulder, not elsewhere classified: Secondary | ICD-10-CM

## 2015-10-25 DIAGNOSIS — M25512 Pain in left shoulder: Secondary | ICD-10-CM

## 2015-10-25 DIAGNOSIS — M19012 Primary osteoarthritis, left shoulder: Secondary | ICD-10-CM | POA: Diagnosis not present

## 2015-11-17 NOTE — Progress Notes (Signed)
[facility] [employer] PREP Weekly Session   Patient Details  Name: Vincent Kent MRN: [id number] Date of Birth: 1946/02/01 Age: 70 y.o. PCP: Horatio Pel, MD  Vitals:   11/17/15 1232  Weight: 162 lb 3.2 oz (73.6 kg)        [facility] Weekly seesion - 11/17/15 1200      Weekly Session   Topic Discussed Hitting roadblocks   Minutes exercised this week 300 minutes  all cardio   Classes attended to date 1   Comments water consumption & rotator cuff        Vanita Ingles 11/17/2015, 12:34 PM

## 2015-11-23 DIAGNOSIS — L602 Onychogryphosis: Secondary | ICD-10-CM | POA: Diagnosis not present

## 2015-11-23 DIAGNOSIS — M216X2 Other acquired deformities of left foot: Secondary | ICD-10-CM | POA: Diagnosis not present

## 2015-11-23 DIAGNOSIS — E119 Type 2 diabetes mellitus without complications: Secondary | ICD-10-CM | POA: Diagnosis not present

## 2015-11-23 DIAGNOSIS — M216X1 Other acquired deformities of right foot: Secondary | ICD-10-CM | POA: Diagnosis not present

## 2015-11-26 NOTE — Progress Notes (Signed)
[facility] PREP Weekly Session   Patient Details  Name: Vincent Kent MRN: [id number] Date of Birth: 02-Nov-1945 Age: 70 y.o. PCP: Horatio Pel, MD  Vitals:   11/26/15 1047  Weight: 161 lb 6.4 oz (73.2 kg)        [facility] Weekly seesion - 11/22/15 1048      Weekly Session   Topic Discussed Hitting roadblocks  guest speaker-Al   Minutes exercised this week 30 minutes  all strength   Classes attended to date 2   Comments "now up to weight"       Vanita Ingles 11/26/2015, 10:50 AM

## 2015-12-01 NOTE — Progress Notes (Signed)
[facility] PREP Weekly Session   Patient Details  Name: Vincent Kent MRN: [id number] Date of Birth: 1945/04/28 Age: 70 y.o. PCP: Horatio Pel, MD  Vitals:   12/01/15 1350  Weight: 163 lb (73.9 kg)        [facility] Weekly seesion - 12/01/15 1300      Weekly Session   Topic Discussed Water   Minutes exercised this week 270 minutes  240 cardio/ 30 strength   Classes attended to date [employer] 12/01/2015, 1:51 PM

## 2015-12-08 DIAGNOSIS — Z0000 Encounter for general adult medical examination without abnormal findings: Secondary | ICD-10-CM | POA: Diagnosis not present

## 2015-12-08 DIAGNOSIS — I10 Essential (primary) hypertension: Secondary | ICD-10-CM | POA: Diagnosis not present

## 2015-12-08 DIAGNOSIS — Z125 Encounter for screening for malignant neoplasm of prostate: Secondary | ICD-10-CM | POA: Diagnosis not present

## 2015-12-08 DIAGNOSIS — E119 Type 2 diabetes mellitus without complications: Secondary | ICD-10-CM | POA: Diagnosis not present

## 2015-12-08 DIAGNOSIS — E7800 Pure hypercholesterolemia, unspecified: Secondary | ICD-10-CM | POA: Diagnosis not present

## 2015-12-14 ENCOUNTER — Other Ambulatory Visit: Payer: Self-pay | Admitting: Registered Nurse

## 2015-12-14 DIAGNOSIS — K862 Cyst of pancreas: Secondary | ICD-10-CM | POA: Diagnosis not present

## 2015-12-14 DIAGNOSIS — K760 Fatty (change of) liver, not elsewhere classified: Secondary | ICD-10-CM | POA: Diagnosis not present

## 2015-12-14 DIAGNOSIS — D225 Melanocytic nevi of trunk: Secondary | ICD-10-CM | POA: Diagnosis not present

## 2015-12-14 DIAGNOSIS — E785 Hyperlipidemia, unspecified: Secondary | ICD-10-CM | POA: Diagnosis not present

## 2015-12-14 DIAGNOSIS — I781 Nevus, non-neoplastic: Secondary | ICD-10-CM | POA: Diagnosis not present

## 2015-12-14 DIAGNOSIS — I10 Essential (primary) hypertension: Secondary | ICD-10-CM | POA: Diagnosis not present

## 2015-12-22 NOTE — Progress Notes (Signed)
[facility] PREP Weekly Session   Patient Details  Name: Vincent Kent MRN: [id number] Date of Birth: 1945-04-14 Age: 70 y.o. PCP: Horatio Pel, MD  Vitals:   12/22/15 1442  Weight: 161 lb 6.4 oz (73.2 kg)        [facility] Weekly seesion - 12/22/15 1400      Weekly Session   Topic Discussed Other ways to be active   Minutes exercised this week 280 minutes  180 cardio/100 strength   Classes attended to date 4   Comments "lower blood sugar, A1c 5.7 at annual physical" "Struggling with a frozen shoulder & PSA of 4.09"    Mr. Stepka had missed a week of class but was able to spend time with his grandkids in [place].    Vanita Ingles 12/22/2015, 2:45 PM

## 2015-12-23 DIAGNOSIS — R3129 Other microscopic hematuria: Secondary | ICD-10-CM | POA: Diagnosis not present

## 2015-12-23 DIAGNOSIS — N400 Enlarged prostate without lower urinary tract symptoms: Secondary | ICD-10-CM | POA: Diagnosis not present

## 2015-12-23 DIAGNOSIS — R972 Elevated prostate specific antigen [PSA]: Secondary | ICD-10-CM | POA: Diagnosis not present

## 2016-01-27 DIAGNOSIS — J069 Acute upper respiratory infection, unspecified: Secondary | ICD-10-CM | POA: Diagnosis not present

## 2016-02-16 DIAGNOSIS — R05 Cough: Secondary | ICD-10-CM | POA: Diagnosis not present

## 2016-02-29 DIAGNOSIS — L821 Other seborrheic keratosis: Secondary | ICD-10-CM | POA: Diagnosis not present

## 2016-02-29 DIAGNOSIS — D225 Melanocytic nevi of trunk: Secondary | ICD-10-CM | POA: Diagnosis not present

## 2016-03-29 DIAGNOSIS — H524 Presbyopia: Secondary | ICD-10-CM | POA: Diagnosis not present

## 2016-03-29 DIAGNOSIS — E119 Type 2 diabetes mellitus without complications: Secondary | ICD-10-CM | POA: Diagnosis not present

## 2016-03-29 DIAGNOSIS — H2513 Age-related nuclear cataract, bilateral: Secondary | ICD-10-CM | POA: Diagnosis not present

## 2016-03-29 DIAGNOSIS — H43813 Vitreous degeneration, bilateral: Secondary | ICD-10-CM | POA: Diagnosis not present

## 2016-04-12 DIAGNOSIS — E119 Type 2 diabetes mellitus without complications: Secondary | ICD-10-CM | POA: Diagnosis not present

## 2016-06-21 DIAGNOSIS — R3129 Other microscopic hematuria: Secondary | ICD-10-CM | POA: Diagnosis not present

## 2016-06-21 DIAGNOSIS — N200 Calculus of kidney: Secondary | ICD-10-CM | POA: Diagnosis not present

## 2016-08-07 ENCOUNTER — Encounter: Payer: Self-pay | Admitting: Gastroenterology

## 2016-08-11 DIAGNOSIS — E119 Type 2 diabetes mellitus without complications: Secondary | ICD-10-CM | POA: Diagnosis not present

## 2016-11-10 ENCOUNTER — Other Ambulatory Visit (HOSPITAL_COMMUNITY): Payer: Self-pay | Admitting: Internal Medicine

## 2016-11-13 DIAGNOSIS — I10 Essential (primary) hypertension: Secondary | ICD-10-CM | POA: Diagnosis not present

## 2016-11-13 DIAGNOSIS — K760 Fatty (change of) liver, not elsewhere classified: Secondary | ICD-10-CM | POA: Diagnosis not present

## 2016-11-13 DIAGNOSIS — R197 Diarrhea, unspecified: Secondary | ICD-10-CM | POA: Diagnosis not present

## 2016-11-16 DIAGNOSIS — R748 Abnormal levels of other serum enzymes: Secondary | ICD-10-CM | POA: Diagnosis not present

## 2016-12-12 IMAGING — CT CT MAXILLOFACIAL W/O CM
3 of 6 series · 15 of 47 positions shown, 18 images · non-contrast
Comparison: None.

CLINICAL DATA: Syncopal episode with fall. Right periorbital soft
tissue swelling.

EXAM:
CT HEAD WITHOUT CONTRAST
CT MAXILLOFACIAL WITHOUT CONTRAST
TECHNIQUE: Multidetector CT imaging of the head and maxillofacial structures
were performed using the standard protocol without intravenous
contrast. Multiplanar CT image reconstructions of the maxillofacial
structures were also generated.

[Series 4: facial st · axial · 0.40mm/px · z∈[+1388,+1518]mm · 10 of 77 slices shown, 13 images]
[im 6/77  brain]
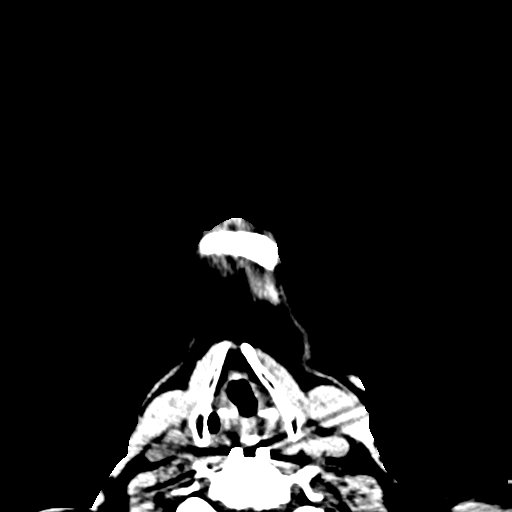
[im 6/77  bone]
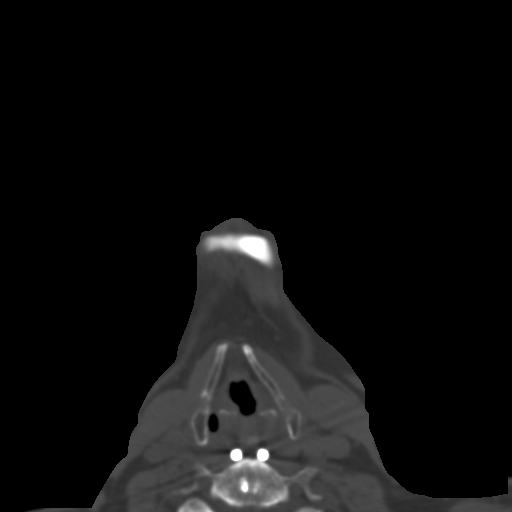
[im 16/77  bone]
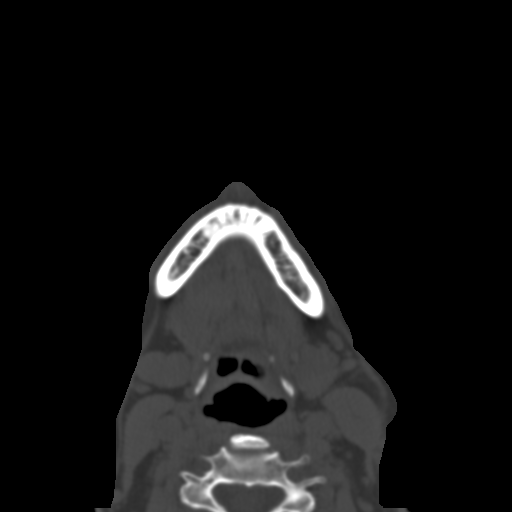
[im 21/77  bone]
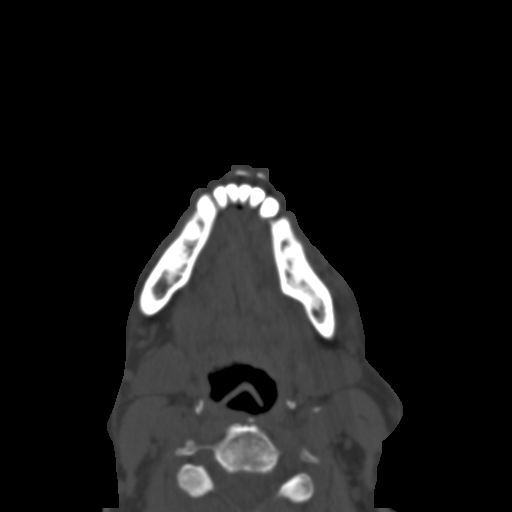
[im 26/77  bone]
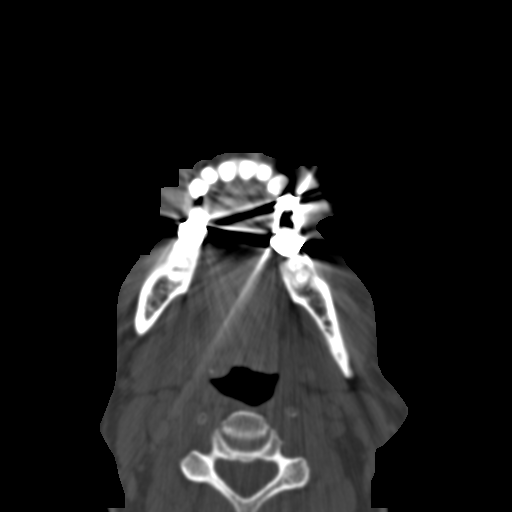
[im 36/77  brain]
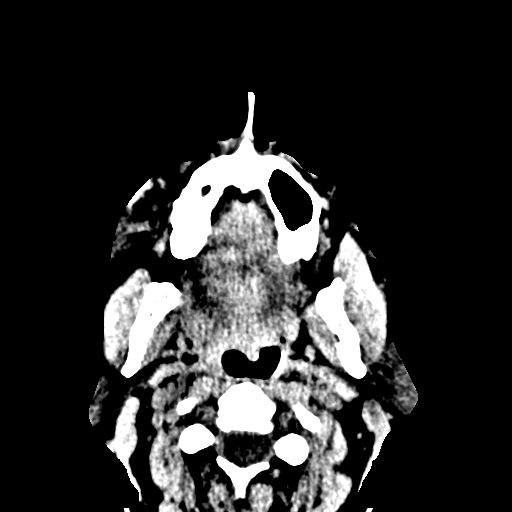
[im 36/77  bone]
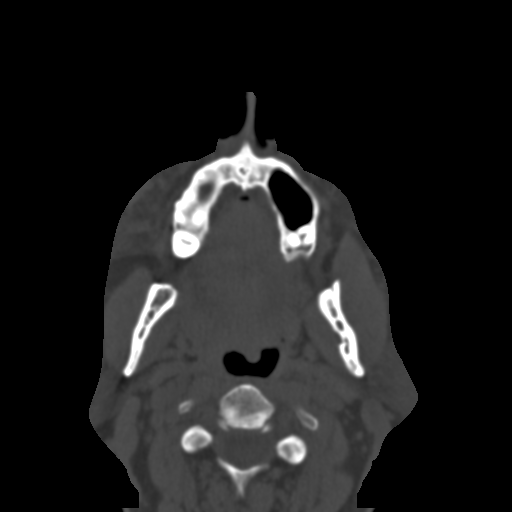
[im 41/77  bone]
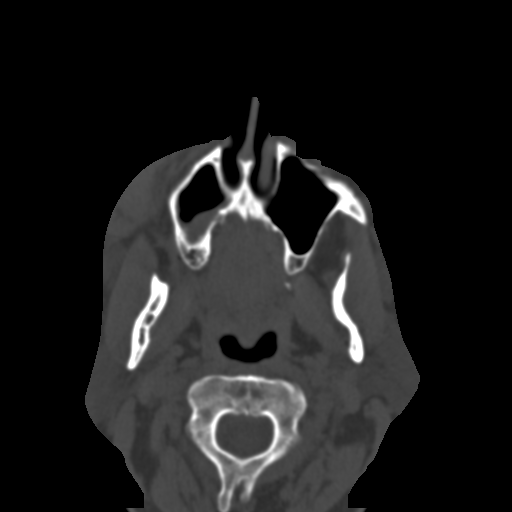
[im 51/77  bone]
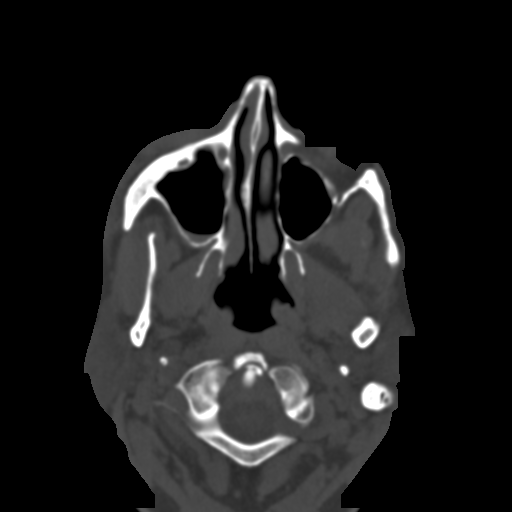
[im 56/77  bone]
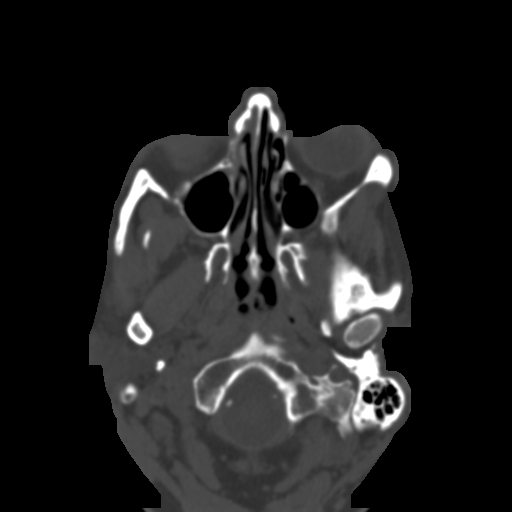
[im 61/77  brain]
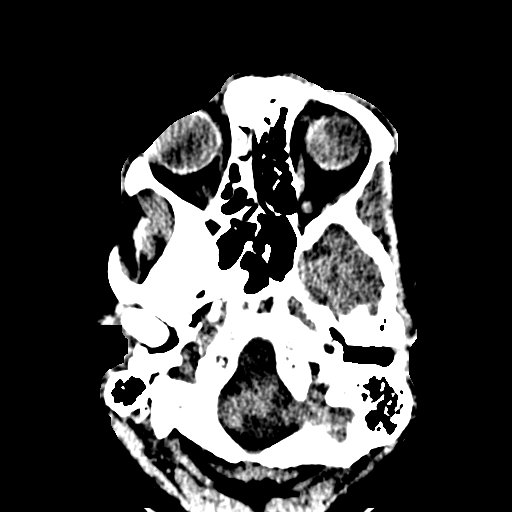
[im 61/77  bone]
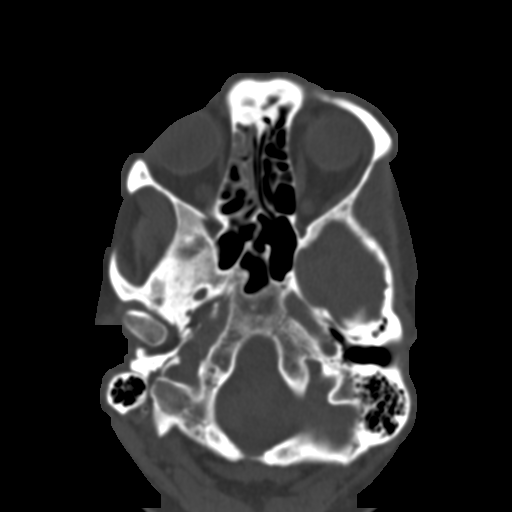
[im 71/77  bone]
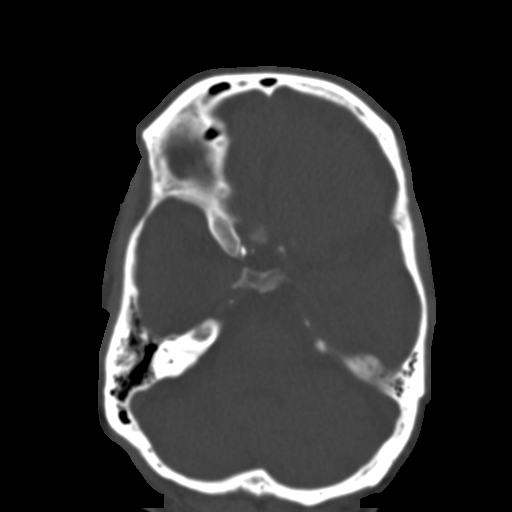

[Series 8: coronal st · coronal · 0.33mm/px · 3 of 75 slices shown]
[im 25/75  bone]
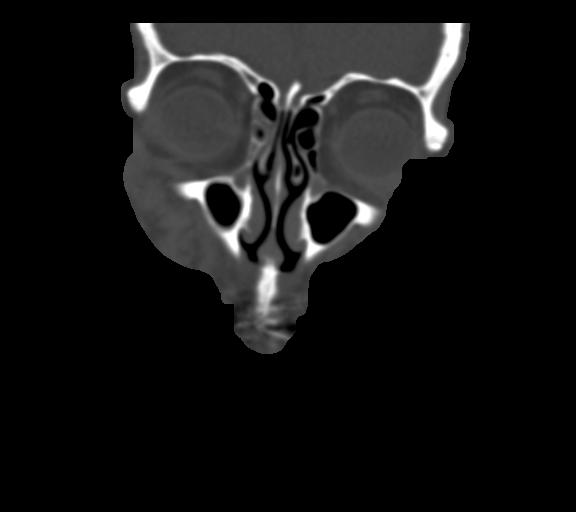
[im 33/75  bone]
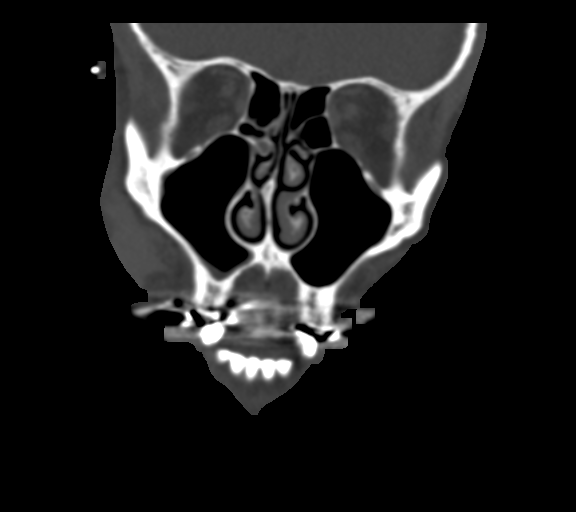
[im 42/75  bone]
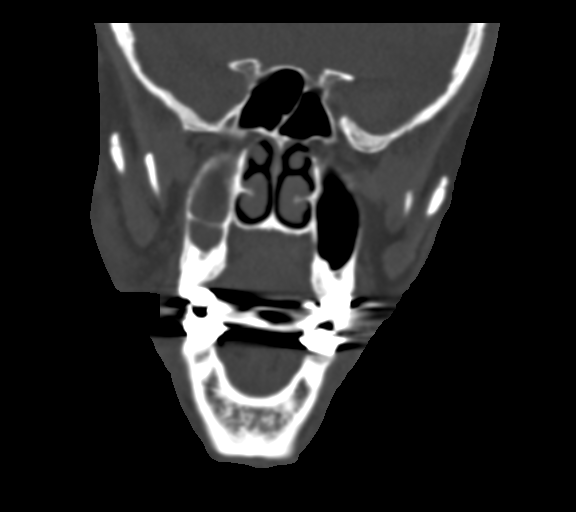

[Series 11: sagittal bone · sagittal · 0.33mm/px · 2 of 76 slices shown]
[im 26/76  bone]
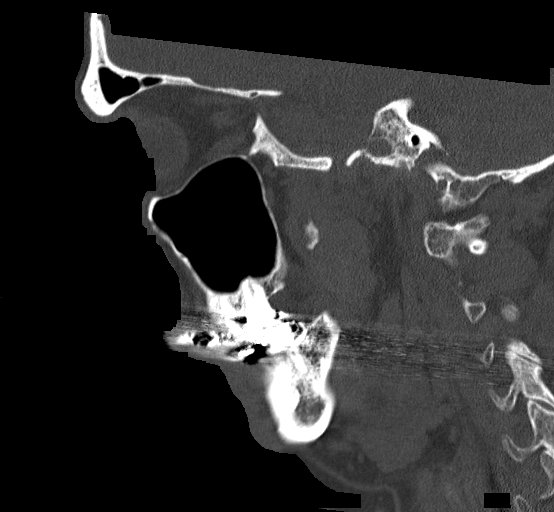
[im 51/76  bone]
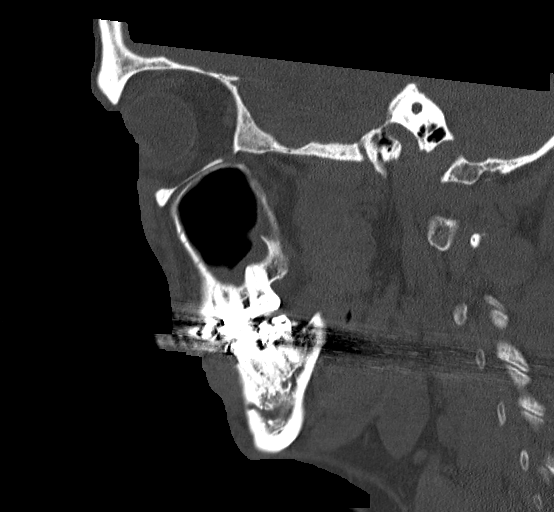

[15 of 47 positions shown; findings below may reference images not displayed]

FINDINGS: CT HEAD FINDINGS

There is no evidence of acute intracranial hemorrhage, mass lesion,
brain edema or acute extra-axial fluid collection. There is
prominent low-density extra-axial fluid adjacent to both frontal
lobes. There are mild small vessel ischemic changes within the
periventricular white matter. The calvarium is intact.

CT MAXILLOFACIAL FINDINGS

There is multifocal soft tissue swelling in the right face involving
the infraorbital and premalar soft tissues. There is minimal
supraorbital soft tissue swelling. No evidence of orbital hematoma
or globe injury. The optic nerves and extraocular muscles are
intact.

No sinus air-fluid levels are identified. There is mucosal
thickening in the right frontal, maxillary and ethmoid sinuses. The
mastoid air cells and middle ears are clear. No evidence of acute
maxillofacial fracture. Postsurgical changes are noted status post
C5-6 ACDF.
IMPRESSION: 1. Right facial soft tissue injury as described. No evidence of
postseptal orbital hematoma.
2. No evidence of acute maxillofacial fracture. Right-sided mucosal
thickening noted.
3. No acute intracranial findings.

## 2016-12-18 DIAGNOSIS — E119 Type 2 diabetes mellitus without complications: Secondary | ICD-10-CM | POA: Diagnosis not present

## 2016-12-18 DIAGNOSIS — E7800 Pure hypercholesterolemia, unspecified: Secondary | ICD-10-CM | POA: Diagnosis not present

## 2016-12-18 DIAGNOSIS — I10 Essential (primary) hypertension: Secondary | ICD-10-CM | POA: Diagnosis not present

## 2016-12-18 DIAGNOSIS — N200 Calculus of kidney: Secondary | ICD-10-CM | POA: Diagnosis not present

## 2016-12-18 DIAGNOSIS — Z125 Encounter for screening for malignant neoplasm of prostate: Secondary | ICD-10-CM | POA: Diagnosis not present

## 2016-12-18 DIAGNOSIS — Z0000 Encounter for general adult medical examination without abnormal findings: Secondary | ICD-10-CM | POA: Diagnosis not present

## 2016-12-21 DIAGNOSIS — R972 Elevated prostate specific antigen [PSA]: Secondary | ICD-10-CM | POA: Diagnosis not present

## 2016-12-21 DIAGNOSIS — K862 Cyst of pancreas: Secondary | ICD-10-CM | POA: Diagnosis not present

## 2016-12-21 DIAGNOSIS — N320 Bladder-neck obstruction: Secondary | ICD-10-CM | POA: Diagnosis not present

## 2016-12-21 DIAGNOSIS — D649 Anemia, unspecified: Secondary | ICD-10-CM | POA: Diagnosis not present

## 2016-12-21 DIAGNOSIS — I6529 Occlusion and stenosis of unspecified carotid artery: Secondary | ICD-10-CM | POA: Diagnosis not present

## 2016-12-21 DIAGNOSIS — I10 Essential (primary) hypertension: Secondary | ICD-10-CM | POA: Diagnosis not present

## 2016-12-21 DIAGNOSIS — K760 Fatty (change of) liver, not elsewhere classified: Secondary | ICD-10-CM | POA: Diagnosis not present

## 2016-12-21 DIAGNOSIS — R3129 Other microscopic hematuria: Secondary | ICD-10-CM | POA: Diagnosis not present

## 2016-12-21 DIAGNOSIS — E1149 Type 2 diabetes mellitus with other diabetic neurological complication: Secondary | ICD-10-CM | POA: Diagnosis not present

## 2016-12-21 DIAGNOSIS — E785 Hyperlipidemia, unspecified: Secondary | ICD-10-CM | POA: Diagnosis not present

## 2016-12-21 DIAGNOSIS — M5090 Cervical disc disorder, unspecified, unspecified cervical region: Secondary | ICD-10-CM | POA: Diagnosis not present

## 2016-12-21 DIAGNOSIS — N200 Calculus of kidney: Secondary | ICD-10-CM | POA: Diagnosis not present

## 2016-12-22 DIAGNOSIS — E139 Other specified diabetes mellitus without complications: Secondary | ICD-10-CM | POA: Diagnosis not present

## 2016-12-22 DIAGNOSIS — M21961 Unspecified acquired deformity of right lower leg: Secondary | ICD-10-CM | POA: Diagnosis not present

## 2016-12-22 DIAGNOSIS — M21962 Unspecified acquired deformity of left lower leg: Secondary | ICD-10-CM | POA: Diagnosis not present

## 2016-12-22 DIAGNOSIS — L602 Onychogryphosis: Secondary | ICD-10-CM | POA: Diagnosis not present

## 2016-12-25 DIAGNOSIS — R972 Elevated prostate specific antigen [PSA]: Secondary | ICD-10-CM | POA: Diagnosis not present

## 2016-12-25 DIAGNOSIS — N200 Calculus of kidney: Secondary | ICD-10-CM | POA: Diagnosis not present

## 2016-12-29 DIAGNOSIS — I10 Essential (primary) hypertension: Secondary | ICD-10-CM | POA: Diagnosis not present

## 2016-12-29 DIAGNOSIS — E1149 Type 2 diabetes mellitus with other diabetic neurological complication: Secondary | ICD-10-CM | POA: Diagnosis not present

## 2017-01-12 DIAGNOSIS — E1149 Type 2 diabetes mellitus with other diabetic neurological complication: Secondary | ICD-10-CM | POA: Diagnosis not present

## 2017-01-23 DIAGNOSIS — E1149 Type 2 diabetes mellitus with other diabetic neurological complication: Secondary | ICD-10-CM | POA: Diagnosis not present

## 2017-01-23 DIAGNOSIS — I10 Essential (primary) hypertension: Secondary | ICD-10-CM | POA: Diagnosis not present

## 2017-03-07 DIAGNOSIS — D225 Melanocytic nevi of trunk: Secondary | ICD-10-CM | POA: Diagnosis not present

## 2017-03-07 DIAGNOSIS — L738 Other specified follicular disorders: Secondary | ICD-10-CM | POA: Diagnosis not present

## 2017-03-07 DIAGNOSIS — L720 Epidermal cyst: Secondary | ICD-10-CM | POA: Diagnosis not present

## 2017-03-07 DIAGNOSIS — L821 Other seborrheic keratosis: Secondary | ICD-10-CM | POA: Diagnosis not present

## 2017-03-19 DIAGNOSIS — H43813 Vitreous degeneration, bilateral: Secondary | ICD-10-CM | POA: Diagnosis not present

## 2017-03-19 DIAGNOSIS — E119 Type 2 diabetes mellitus without complications: Secondary | ICD-10-CM | POA: Diagnosis not present

## 2017-03-19 DIAGNOSIS — H2513 Age-related nuclear cataract, bilateral: Secondary | ICD-10-CM | POA: Diagnosis not present

## 2017-03-19 DIAGNOSIS — H524 Presbyopia: Secondary | ICD-10-CM | POA: Diagnosis not present

## 2017-04-09 DIAGNOSIS — E1149 Type 2 diabetes mellitus with other diabetic neurological complication: Secondary | ICD-10-CM | POA: Diagnosis not present

## 2017-04-16 DIAGNOSIS — E1149 Type 2 diabetes mellitus with other diabetic neurological complication: Secondary | ICD-10-CM | POA: Diagnosis not present

## 2017-06-26 DIAGNOSIS — R972 Elevated prostate specific antigen [PSA]: Secondary | ICD-10-CM | POA: Diagnosis not present

## 2017-06-26 DIAGNOSIS — R8271 Bacteriuria: Secondary | ICD-10-CM | POA: Diagnosis not present

## 2017-06-28 DIAGNOSIS — R3121 Asymptomatic microscopic hematuria: Secondary | ICD-10-CM | POA: Diagnosis not present

## 2017-06-28 DIAGNOSIS — N200 Calculus of kidney: Secondary | ICD-10-CM | POA: Diagnosis not present

## 2017-06-28 DIAGNOSIS — R972 Elevated prostate specific antigen [PSA]: Secondary | ICD-10-CM | POA: Diagnosis not present

## 2017-07-24 DIAGNOSIS — R972 Elevated prostate specific antigen [PSA]: Secondary | ICD-10-CM | POA: Diagnosis not present

## 2017-07-24 DIAGNOSIS — E1149 Type 2 diabetes mellitus with other diabetic neurological complication: Secondary | ICD-10-CM | POA: Diagnosis not present

## 2017-07-24 DIAGNOSIS — D4989 Neoplasm of unspecified behavior of other specified sites: Secondary | ICD-10-CM | POA: Diagnosis not present

## 2017-07-24 DIAGNOSIS — N529 Male erectile dysfunction, unspecified: Secondary | ICD-10-CM | POA: Diagnosis not present

## 2017-08-22 DIAGNOSIS — L820 Inflamed seborrheic keratosis: Secondary | ICD-10-CM | POA: Diagnosis not present

## 2017-08-22 DIAGNOSIS — D485 Neoplasm of uncertain behavior of skin: Secondary | ICD-10-CM | POA: Diagnosis not present

## 2017-09-21 DIAGNOSIS — R972 Elevated prostate specific antigen [PSA]: Secondary | ICD-10-CM | POA: Diagnosis not present

## 2017-09-26 DIAGNOSIS — R311 Benign essential microscopic hematuria: Secondary | ICD-10-CM | POA: Diagnosis not present

## 2017-09-26 DIAGNOSIS — N400 Enlarged prostate without lower urinary tract symptoms: Secondary | ICD-10-CM | POA: Diagnosis not present

## 2017-09-26 DIAGNOSIS — R972 Elevated prostate specific antigen [PSA]: Secondary | ICD-10-CM | POA: Diagnosis not present

## 2017-10-04 DIAGNOSIS — E139 Other specified diabetes mellitus without complications: Secondary | ICD-10-CM | POA: Diagnosis not present

## 2017-10-04 DIAGNOSIS — M79674 Pain in right toe(s): Secondary | ICD-10-CM | POA: Diagnosis not present

## 2017-10-04 DIAGNOSIS — L602 Onychogryphosis: Secondary | ICD-10-CM | POA: Diagnosis not present

## 2017-10-24 DIAGNOSIS — R972 Elevated prostate specific antigen [PSA]: Secondary | ICD-10-CM | POA: Diagnosis not present

## 2017-10-24 DIAGNOSIS — C61 Malignant neoplasm of prostate: Secondary | ICD-10-CM | POA: Diagnosis not present

## 2017-10-31 DIAGNOSIS — C61 Malignant neoplasm of prostate: Secondary | ICD-10-CM | POA: Diagnosis not present

## 2017-11-02 ENCOUNTER — Encounter: Payer: Self-pay | Admitting: Radiation Oncology

## 2017-11-29 ENCOUNTER — Ambulatory Visit
Admission: RE | Admit: 2017-11-29 | Discharge: 2017-11-29 | Disposition: A | Discharge status: Home / Self Care | Payer: Medicare Other | Source: Ambulatory Visit | Attending: Radiation Oncology | Admitting: Radiation Oncology

## 2017-11-29 ENCOUNTER — Other Ambulatory Visit: Payer: Self-pay

## 2017-11-29 ENCOUNTER — Encounter: Payer: Self-pay | Admitting: Radiation Oncology

## 2017-11-29 ENCOUNTER — Encounter: Payer: Self-pay | Admitting: Medical Oncology

## 2017-11-29 ENCOUNTER — Encounter

## 2017-11-29 VITALS — BP 145/93 | HR 85 | Temp 97.9°F | Resp 18 | Ht 70.0 in | Wt 168.5 lb

## 2017-11-29 DIAGNOSIS — Z7982 Long term (current) use of aspirin: Secondary | ICD-10-CM | POA: Diagnosis not present

## 2017-11-29 DIAGNOSIS — E119 Type 2 diabetes mellitus without complications: Secondary | ICD-10-CM | POA: Insufficient documentation

## 2017-11-29 DIAGNOSIS — C61 Malignant neoplasm of prostate: Secondary | ICD-10-CM

## 2017-11-29 DIAGNOSIS — Z79899 Other long term (current) drug therapy: Secondary | ICD-10-CM | POA: Insufficient documentation

## 2017-11-29 DIAGNOSIS — R972 Elevated prostate specific antigen [PSA]: Secondary | ICD-10-CM | POA: Diagnosis not present

## 2017-11-29 DIAGNOSIS — Z7984 Long term (current) use of oral hypoglycemic drugs: Secondary | ICD-10-CM | POA: Diagnosis not present

## 2017-11-29 DIAGNOSIS — I10 Essential (primary) hypertension: Secondary | ICD-10-CM | POA: Insufficient documentation

## 2017-11-29 HISTORY — DX: Malignant neoplasm of prostate: C61

## 2017-11-29 NOTE — Progress Notes (Signed)
Radiation Oncology         [phone]) [phone] ________________________________  Initial Outpatient Consultation  Name: Vincent Kent MRN: [id number]  Date: 11/29/2017  DOB: December 25, 1945  HU:TMLYY, Thayer Jew, MD  Festus Aloe, MD   REFERRING PHYSICIAN: Festus Aloe, MD  DIAGNOSIS: 72 y.o. 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    ICD-10-CM   1. Malignant neoplasm of prostate ([place]) C61     HISTORY OF PRESENT ILLNESS: Vincent Kent is a 72 y.o. male with a diagnosis of prostate cancer. He is an established patient of Dr. Junious Silk for history of elevated PSA and uric acid lithiasis. He was noted to have a further elevated PSA of 7.58 in August 2019.  Accordingly, the patient proceeded to transrectal ultrasound with 12 biopsies of the prostate on 10/24/17.  Digital rectal examination was performed at that time revealing symmetric prostate lobes without nodularity. The prostate volume measured 37.58 cc.  Out of 12 core biopsies, 2 were positive.  The maximum Gleason score was 4+3, and this was seen in the left mid and left apex.  Biopsies of prostate revealed:    The patient reviewed the biopsy results with his urologist and he has kindly been referred today for discussion of potential radiation treatment options.   PREVIOUS RADIATION THERAPY: No  PAST MEDICAL HISTORY:  Past Medical History:  Diagnosis Date  . Carotid stenosis   . Cervical disc disease   . Cervical radiculopathy   . Diabetes mellitus   . Diabetes mellitus without complication ([place])   . Erectile dysfunction   . Hepatic steatosis   . Hyperlipidemia   . Hypertension   . Pelvic fracture ([place])   . Polyp of colon, adenomatous 10/27/2011   August 2013 - for f/u colonoscopy at 3 yrs  . Prostate cancer ([place])   . Sciatica 07/17/2011      PAST SURGICAL HISTORY: Past Surgical History:  Procedure Laterality Date  . bone spur  2012   cervical bone  spur removal  . HERNIA REPAIR    . INGUINAL HERNIA REPAIR Bilateral 1990   left x 2,right 1  . PROSTATE BIOPSY    . TONSILLECTOMY  1949    FAMILY HISTORY:  Family History  Problem Relation Age of Onset  . Heart disease Mother   . Stroke Mother   . Cancer Mother        type unknown  . Dementia Mother   . Heart disease Father   . Colon cancer Neg Hx     SOCIAL HISTORY:  Social History   Socioeconomic History  . Marital status: Married    Spouse name: Not on file  . Number of children: 2  . Years of education: 39  . Highest education level: Not on file  Occupational History  . Occupation: Adult nurse, Brewing technologist  . Occupation: retired  Scientific laboratory technician  . Financial resource strain: Not on file  . Food insecurity:    Worry: Not on file    Inability: Not on file  . Transportation needs:    Medical: Not on file    Non-medical: Not on file  Tobacco Use  . Smoking status: Never Smoker  . Smokeless tobacco: Never Used  Substance and Sexual Activity  . Alcohol use: Yes    Comment: 4 per month  . Drug use: No  . Sexual activity: Not Currently  Lifestyle  . Physical activity:    Days per week: Not on file  Minutes per session: Not on file  . Stress: Not on file  Relationships  . Social connections:    Talks on phone: Not on file    Gets together: Not on file    Attends religious service: Not on file    Active member of club or organization: Not on file    Attends meetings of clubs or organizations: Not on file    Relationship status: Not on file  . Intimate partner violence:    Fear of current or ex partner: Not on file    Emotionally abused: Not on file    Physically abused: Not on file    Forced sexual activity: Not on file  Other Topics Concern  . Not on file  Social History Narrative   ** Merged History Encounter **      Patient accompanied today by wife.  ALLERGIES: Flomax [tamsulosin hcl] and Viagra [sildenafil citrate]  MEDICATIONS:  Current Outpatient  Medications  Medication Sig Dispense Refill  . aspirin 81 MG tablet Take 81 mg by mouth daily.    Marland Kitchen atorvastatin (LIPITOR) 10 MG tablet Take 1 tablet (10 mg total) by mouth daily. 90 tablet 3  . glipiZIDE (GLUCOTROL XL) 5 MG 24 hr tablet Take 5 mg by mouth daily with breakfast.    . glucose blood (ONE TOUCH ULTRA TEST) test strip Use as directed twice daily as directed.  Diagnosis code 250.02 300 each 3  . losartan (COZAAR) 25 MG tablet Take 25 mg by mouth daily.    . potassium citrate (UROCIT-K) 10 MEQ (1080 MG) SR tablet Take 15 mEq by mouth 3 (three) times daily with meals.    . sitaGLIPtan-metformin (JANUMET) 50-1000 MG per tablet Take 1 tablet by mouth 2 (two) times daily with a meal. 180 tablet 3  . tadalafil (CIALIS) 20 MG tablet Take 1 tablet (20 mg total) by mouth daily as needed for erectile dysfunction. 6 monthly 18 tablet 3  . diphenhydrAMINE (SOMINEX) 25 MG tablet Take 25-50 mg by mouth daily as needed for allergies. Reported on 06/18/2015    . HYDROcodone-acetaminophen (NORCO/VICODIN) 5-325 MG tablet Take 1 tablet by mouth every 4 (four) hours as needed for moderate pain. (Patient not taking: Reported on 06/18/2015) 20 tablet 0  . ondansetron (ZOFRAN) 4 MG tablet Take 1 tablet (4 mg total) by mouth every 8 (eight) hours as needed for nausea or vomiting. (Patient not taking: Reported on 06/18/2015) 11 tablet 0  . tamsulosin (FLOMAX) 0.4 MG CAPS capsule Take 0.4 mg by mouth daily. Reported on 06/18/2015  0   No current facility-administered medications for this encounter.     REVIEW OF SYSTEMS:  On review of systems, the patient reports that he is doing well overall. He denies any chest pain, shortness of breath, cough, fevers, chills, night sweats, or unintended weight changes. He denies any bowel disturbances, and denies abdominal pain, nausea or vomiting. He denies any new musculoskeletal or joint aches or pains. His IPSS was 7, indicating mild urinary symptoms with frequency and urgency.  His SHIM was 10, indicating he does have erectile dysfunction. A complete review of systems is obtained and is otherwise negative.    PHYSICAL EXAM:  Wt Readings from Last 3 Encounters:  11/29/17 168 lb 8 oz (76.4 kg)  12/22/15 161 lb 6.4 oz (73.2 kg)  12/01/15 163 lb (73.9 kg)   Temp Readings from Last 3 Encounters:  11/29/17 97.9 F (36.6 C) (Oral)  03/23/15 98.3 F (36.8 C) (Oral)  03/22/15  97.7 F (36.5 C) (Oral)   BP Readings from Last 3 Encounters:  11/29/17 (!) 145/93  03/23/15 133/84  03/22/15 138/77   Pulse Readings from Last 3 Encounters:  11/29/17 85  03/23/15 73  03/22/15 80   Pain Assessment Pain Score: 0-No pain/10  In general this is a well appearing caucasian male in no acute distress. He is alert and oriented x4 and appropriate throughout the examination. HEENT reveals that the patient is normocephalic, atraumatic. EOMs are intact. PERRLA. Skin is intact without any evidence of gross lesions. Cardiovascular exam reveals a regular rate and rhythm, no clicks rubs or murmurs are auscultated. Chest is clear to auscultation bilaterally. Lymphatic assessment is performed and does not reveal any adenopathy in the cervical, supraclavicular, axillary, or inguinal chains. Abdomen has active bowel sounds in all quadrants and is intact. The abdomen is soft, non tender, non distended. Lower extremities are negative for pretibial pitting edema, deep calf tenderness, cyanosis or clubbing.   KPS = 100  100 - Normal; no complaints; no evidence of disease. 90   - Able to carry on normal activity; minor signs or symptoms of disease. 80   - Normal activity with effort; some signs or symptoms of disease. 58   - Cares for self; unable to carry on normal activity or to do active work. 60   - Requires occasional assistance, but is able to care for most of his personal needs. 50   - Requires considerable assistance and frequent medical care. 59   - Disabled; requires special care and  assistance. 22   - Severely disabled; hospital admission is indicated although death not imminent. 64   - Very sick; hospital admission necessary; active supportive treatment necessary. 10   - Moribund; fatal processes progressing rapidly. 0     - Dead  Karnofsky DA, Abelmann [place], Craver LS and Burchenal [facility] [phone]) The use of the nitrogen mustards in the palliative treatment of carcinoma: with particular reference to bronchogenic carcinoma Cancer 1 [phone]  LABORATORY DATA:  Lab Results  Component Value Date   WBC 12.9 (H) 03/19/2015   HGB 13.9 03/22/2015   HCT 41.0 03/22/2015   MCV 96.2 03/19/2015   PLT 186 03/19/2015   Lab Results  Component Value Date   NA 139 03/22/2015   K 4.7 03/22/2015   CL 103 03/22/2015   CO2 24 03/19/2015   Lab Results  Component Value Date   ALT 23 03/19/2015   AST 27 03/19/2015   ALKPHOS 61 03/19/2015   BILITOT 0.9 03/19/2015     RADIOGRAPHY: No results found.    IMPRESSION/PLAN: 1. 72 y.o. gentleman with Stage T1c adenocarcinoma of the prostate with Gleason Score of 4+3, and PSA of 7.58. We discussed the patient's workup and outlined the nature of prostate cancer in this setting. The patient's T stage, Gleason's score, and PSA put him into the unfavorable, intermediate risk group. Accordingly, he is eligible for a variety of potential treatment options including prostatectomy, brachytherapy, or 5.5-8 weeks of external radiation. We discussed the available radiation techniques, and focused on the details and logistics and delivery. We discussed and outlined the risks, benefits, short and long-term effects associated with radiotherapy and compared and contrasted these with prostatectomy. We discussed the role of SpaceOAR in reducing the rectal toxicity associated with radiotherapy.  At the conclusion of our conversation the patient is leaning towards prostate brachytherapy but has not reached a final decision. He has a scheduled follow-up visit with Dr.  Junious Silk  on 12/26/17 to further discuss treatment options and plans to let us know his final decision following that visit.  We will share our discussion with Dr. Junious Silk and pending he elects to proceed, we will move forward with coordinating the brachytherapy procedure.  The patient met briefly with Romie Jumper in our office who will be working closely with him to coordinate OR scheduling and pre and post procedure appointments.  Once he has reached his final decision, we will contact the pharmaceutical rep to ensure that [place] is available at the time of procedure and he will have a prostate MRI following his post-seed CT SIM to confirm appropriate distribution of the [place].  We spent 60 minutes face to face with the patient and more than 50% of that time was spent in counseling and/or coordination of care.    Nicholos Johns, PA-C    Tyler Pita, MD  [facility] Direct Dial: [phone]  Fax: [phone] [email or web address]  Skype  [email or web address]  This document serves as a record of services personally performed by Tyler Pita, MD and Freeman Caldron, PA-C. It was created on their behalf by Rae Lips, a trained medical scribe. The creation of this record is based on the scribe's personal observations and the providers' statements to them. This document has been checked and approved by the attending providers.

## 2017-11-29 NOTE — Progress Notes (Signed)
GU Location of Tumor / Histology: prostatic adenocarcinoma  If Prostate Cancer, Gleason Score is (4 + 3) and PSA is (7.5). Prostate volume: 38 grams  Plummer Matich is an established patient of Dr. Junious Silk. Patient with history of elevated PSA as well as uric acid lithiasis. Patient's PSA in 09/2017 was 7.5 up from 4.07 in 2017.   Biopsies of prostate (if applicable) revealed:    Past/Anticipated interventions by urology, if any: prostate biopsy, referral to radiation oncology  Past/Anticipated interventions by medical oncology, if any: no  Weight changes, if any: No  Bowel/Bladder complaints, if any: Voids with a good stream. Reports urinary urgency. Reports 3-4 hours between trips to the bathroom.   Nausea/Vomiting, if any: No  Pain issues, if any:  No  SAFETY ISSUES:  Prior radiation? No  Pacemaker/ICD? No  Possible current pregnancy? no  Is the patient on methotrexate? No  Current Complaints / other details:  72 year old male. Married. Pt presents today for initial consult with Dr. Tammi Klippel for Radiation Oncology. Pt is accompanied by wife.   BP (!) 145/93 (BP Location: Right Arm, Patient Position: Sitting)   Pulse 85   Temp 97.9 F (36.6 C) (Oral)   Resp 18   Ht 5\' 10"  (1.778 m)   Wt 168 lb 8 oz (76.4 kg)   SpO2 97%   BMI 24.18 kg/m   Wt Readings from Last 3 Encounters:  11/29/17 168 lb 8 oz (76.4 kg)  12/22/15 161 lb 6.4 oz (73.2 kg)  12/01/15 163 lb (73.9 kg)   Loma Sousa, RN BSN

## 2017-12-03 DIAGNOSIS — C61 Malignant neoplasm of prostate: Secondary | ICD-10-CM | POA: Insufficient documentation

## 2017-12-25 DIAGNOSIS — E7800 Pure hypercholesterolemia, unspecified: Secondary | ICD-10-CM | POA: Diagnosis not present

## 2017-12-25 DIAGNOSIS — E119 Type 2 diabetes mellitus without complications: Secondary | ICD-10-CM | POA: Diagnosis not present

## 2017-12-25 DIAGNOSIS — Z125 Encounter for screening for malignant neoplasm of prostate: Secondary | ICD-10-CM | POA: Diagnosis not present

## 2017-12-25 DIAGNOSIS — I10 Essential (primary) hypertension: Secondary | ICD-10-CM | POA: Diagnosis not present

## 2017-12-26 DIAGNOSIS — C61 Malignant neoplasm of prostate: Secondary | ICD-10-CM | POA: Diagnosis not present

## 2018-01-02 DIAGNOSIS — I10 Essential (primary) hypertension: Secondary | ICD-10-CM | POA: Diagnosis not present

## 2018-01-02 DIAGNOSIS — K760 Fatty (change of) liver, not elsewhere classified: Secondary | ICD-10-CM | POA: Diagnosis not present

## 2018-01-02 DIAGNOSIS — I6529 Occlusion and stenosis of unspecified carotid artery: Secondary | ICD-10-CM | POA: Diagnosis not present

## 2018-01-02 DIAGNOSIS — C61 Malignant neoplasm of prostate: Secondary | ICD-10-CM | POA: Diagnosis not present

## 2018-01-02 DIAGNOSIS — D649 Anemia, unspecified: Secondary | ICD-10-CM | POA: Diagnosis not present

## 2018-01-02 DIAGNOSIS — E7800 Pure hypercholesterolemia, unspecified: Secondary | ICD-10-CM | POA: Diagnosis not present

## 2018-01-02 DIAGNOSIS — Z0000 Encounter for general adult medical examination without abnormal findings: Secondary | ICD-10-CM | POA: Diagnosis not present

## 2018-01-02 DIAGNOSIS — E1149 Type 2 diabetes mellitus with other diabetic neurological complication: Secondary | ICD-10-CM | POA: Diagnosis not present

## 2018-01-02 DIAGNOSIS — M5090 Cervical disc disorder, unspecified, unspecified cervical region: Secondary | ICD-10-CM | POA: Diagnosis not present

## 2018-01-02 DIAGNOSIS — E875 Hyperkalemia: Secondary | ICD-10-CM | POA: Diagnosis not present

## 2018-01-02 DIAGNOSIS — K862 Cyst of pancreas: Secondary | ICD-10-CM | POA: Diagnosis not present

## 2018-01-02 DIAGNOSIS — N200 Calculus of kidney: Secondary | ICD-10-CM | POA: Diagnosis not present

## 2018-01-03 ENCOUNTER — Telehealth: Payer: Self-pay | Admitting: Medical Oncology

## 2018-01-03 DIAGNOSIS — E875 Hyperkalemia: Secondary | ICD-10-CM | POA: Diagnosis not present

## 2018-01-03 DIAGNOSIS — E119 Type 2 diabetes mellitus without complications: Secondary | ICD-10-CM | POA: Diagnosis not present

## 2018-01-03 DIAGNOSIS — D649 Anemia, unspecified: Secondary | ICD-10-CM | POA: Diagnosis not present

## 2018-01-03 NOTE — Telephone Encounter (Signed)
Mr. Vincent Kent called stating he is anxious regarding his diagnosis of prostate cancer and having to wait until January for brachytherapy. He is a retired Social worker and would like to know if I know of a counselor he could see. I discussed our spiritual care and wholeness program. He is not sure if this will meet his needs but he is willing to speak with Lorrin Jackson. He was given number to reach [place].

## 2018-01-04 ENCOUNTER — Other Ambulatory Visit: Payer: Self-pay | Admitting: Urology

## 2018-01-04 ENCOUNTER — Telehealth: Payer: Self-pay | Admitting: *Deleted

## 2018-01-04 NOTE — Telephone Encounter (Signed)
CALLED PATIENT TO INFORM OF PRE-SEED PLANNING CT AND IMPLANT DATE, SPOKE WITH PATIENT AND HE IS AWARE OF THESE APPTS.

## 2018-01-07 DIAGNOSIS — Z1159 Encounter for screening for other viral diseases: Secondary | ICD-10-CM | POA: Diagnosis not present

## 2018-01-07 DIAGNOSIS — E119 Type 2 diabetes mellitus without complications: Secondary | ICD-10-CM | POA: Diagnosis not present

## 2018-01-07 DIAGNOSIS — D649 Anemia, unspecified: Secondary | ICD-10-CM | POA: Diagnosis not present

## 2018-01-07 DIAGNOSIS — E875 Hyperkalemia: Secondary | ICD-10-CM | POA: Diagnosis not present

## 2018-01-25 ENCOUNTER — Encounter: Payer: Self-pay | Admitting: Medical Oncology

## 2018-01-25 ENCOUNTER — Telehealth: Payer: Self-pay | Admitting: Medical Oncology

## 2018-01-25 NOTE — Telephone Encounter (Signed)
Informed patient that our office will add PSA to his pre-op labs and I notified Dr. Lyndal Rainbow office. They will cancel appointment 12/17. He thanked me and voiced understanding.

## 2018-01-25 NOTE — Telephone Encounter (Signed)
Patient called stating his PSA lab at [facility] 12/17. He has pre-op labs for seed implant 12/20. He would like to consolidate labs and not be stuck twice. He is a hard stick. He spoke with Dr. Lyndal Rainbow nurse and she said if we will add PSA to pre-op labs she will cancel labs in their office. I told him I think this is doable. I will check with Enid Derry and give him a call back.

## 2018-01-25 NOTE — Progress Notes (Signed)
Spoke with [facility] to ask if we can add PSA to pre-op labs. She will ask Dr. Johny Shears nurse to add to the labs. I called [facility] and spoke with Lattie Haw. She will let Dr. Junious Silk know we will draw PSA and she will cancel lab appointment for 12/17.

## 2018-01-31 ENCOUNTER — Telehealth: Payer: Self-pay | Admitting: *Deleted

## 2018-01-31 NOTE — Telephone Encounter (Signed)
CALLED PATIENT TO REMIND OF PRE-SEED APPTS. FOR 02-01-18, LVM FOR A RETURN CALL

## 2018-02-01 ENCOUNTER — Other Ambulatory Visit: Payer: Self-pay | Admitting: Urology

## 2018-02-01 ENCOUNTER — Ambulatory Visit
Admission: RE | Admit: 2018-02-01 | Discharge: 2018-02-01 | Disposition: A | Discharge status: Home / Self Care | Payer: Medicare Other | Source: Ambulatory Visit | Attending: Radiation Oncology | Admitting: Radiation Oncology

## 2018-02-01 ENCOUNTER — Encounter (HOSPITAL_COMMUNITY)
Admission: RE | Admit: 2018-02-01 | Discharge: 2018-02-01 | Disposition: A | Discharge status: Home / Self Care | Payer: Medicare Other | Source: Ambulatory Visit | Attending: Urology | Admitting: Urology

## 2018-02-01 ENCOUNTER — Encounter: Payer: Self-pay | Admitting: Medical Oncology

## 2018-02-01 ENCOUNTER — Ambulatory Visit (HOSPITAL_COMMUNITY)
Admission: RE | Admit: 2018-02-01 | Discharge: 2018-02-01 | Disposition: A | Discharge status: Home / Self Care | Payer: Medicare Other | Source: Ambulatory Visit | Attending: Urology | Admitting: Urology

## 2018-02-01 DIAGNOSIS — C61 Malignant neoplasm of prostate: Secondary | ICD-10-CM | POA: Diagnosis not present

## 2018-02-01 DIAGNOSIS — Z01818 Encounter for other preprocedural examination: Secondary | ICD-10-CM | POA: Diagnosis not present

## 2018-02-01 DIAGNOSIS — R972 Elevated prostate specific antigen [PSA]: Secondary | ICD-10-CM | POA: Diagnosis not present

## 2018-02-01 NOTE — Progress Notes (Signed)
  Radiation Oncology         [phone] ________________________________  Name: GAY RAPE MRN: [id number]  Date: 02/01/2018  DOB: 1945-06-16  SIMULATION AND TREATMENT PLANNING NOTE PUBIC ARCH STUDY  MI:WOEHO, Thayer Jew, MD  Festus Aloe, MD  DIAGNOSIS: 72 y.o. 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     ICD-10-CM   1. Malignant neoplasm of prostate ([place]) C61     COMPLEX SIMULATION:  The patient presented today for evaluation for possible prostate seed implant. He was brought to the radiation planning suite and placed supine on the CT couch. A 3-dimensional image study set was obtained in upload to the planning computer. There, on each axial slice, I contoured the prostate gland. Then, using three-dimensional radiation planning tools I reconstructed the prostate in view of the structures from the transperineal needle pathway to assess for possible pubic arch interference. In doing so, I did not appreciate any pubic arch interference. Also, the patient's prostate volume was estimated based on the drawn structure. The volume was 39 cc.  Given the pubic arch appearance and prostate volume, patient remains a good candidate to proceed with prostate seed implant. Today, he freely provided informed written consent to proceed.    PLAN: The patient will undergo prostate seed implant.   ________________________________  Sheral Apley. Tammi Klippel, M.D.  This document serves as a record of services personally performed by Tyler Pita, MD. It was created on his behalf by Arlyce Harman, a trained medical scribe. The creation of this record is based on the scribe's personal observations and the provider's statements to them. This document has been checked and approved by the attending provider.

## 2018-02-08 ENCOUNTER — Other Ambulatory Visit: Payer: Self-pay | Admitting: Urology

## 2018-02-08 DIAGNOSIS — C61 Malignant neoplasm of prostate: Secondary | ICD-10-CM

## 2018-02-11 ENCOUNTER — Telehealth: Payer: Self-pay | Admitting: *Deleted

## 2018-02-11 NOTE — Telephone Encounter (Signed)
CALLED PATIENT TO INFORM OF NEW IMPLANT DATE, SPOKE WITH PATIENT AND HE IS AWARE OF THE NEW DATE

## 2018-02-21 DIAGNOSIS — E119 Type 2 diabetes mellitus without complications: Secondary | ICD-10-CM | POA: Diagnosis not present

## 2018-02-21 DIAGNOSIS — I10 Essential (primary) hypertension: Secondary | ICD-10-CM | POA: Diagnosis not present

## 2018-02-21 DIAGNOSIS — E7800 Pure hypercholesterolemia, unspecified: Secondary | ICD-10-CM | POA: Diagnosis not present

## 2018-03-06 NOTE — Progress Notes (Signed)
Called connei mabe and asked it patient should stop potassium citrate prior to surgery, per connie mabe potassium citrate is ok to take. Called patient back and instructed patient ok to stay on potassium citrate prior to surgery

## 2018-03-08 ENCOUNTER — Other Ambulatory Visit (HOSPITAL_COMMUNITY): Payer: Self-pay

## 2018-03-11 ENCOUNTER — Telehealth: Payer: Self-pay | Admitting: *Deleted

## 2018-03-11 DIAGNOSIS — H2513 Age-related nuclear cataract, bilateral: Secondary | ICD-10-CM | POA: Diagnosis not present

## 2018-03-11 DIAGNOSIS — H524 Presbyopia: Secondary | ICD-10-CM | POA: Diagnosis not present

## 2018-03-11 DIAGNOSIS — H25013 Cortical age-related cataract, bilateral: Secondary | ICD-10-CM | POA: Diagnosis not present

## 2018-03-11 DIAGNOSIS — E119 Type 2 diabetes mellitus without complications: Secondary | ICD-10-CM | POA: Diagnosis not present

## 2018-03-11 NOTE — Telephone Encounter (Signed)
CALLED PATIENT TO REMIND OF LAB APPT. FOR 03-12-18 - ARRIVAL TIME - 10:45 AM @ [facility] ADMITTING, SPOKE WITH PATIENT AND HE IS AWARE OF THIS APPT.

## 2018-03-12 ENCOUNTER — Other Ambulatory Visit: Payer: Self-pay

## 2018-03-12 ENCOUNTER — Encounter (HOSPITAL_COMMUNITY)
Admission: RE | Admit: 2018-03-12 | Discharge: 2018-03-12 | Disposition: A | Discharge status: Home / Self Care | Payer: Medicare Other | Source: Ambulatory Visit | Attending: Urology | Admitting: Urology

## 2018-03-12 ENCOUNTER — Encounter (HOSPITAL_BASED_OUTPATIENT_CLINIC_OR_DEPARTMENT_OTHER): Payer: Self-pay | Admitting: *Deleted

## 2018-03-12 DIAGNOSIS — Z01812 Encounter for preprocedural laboratory examination: Secondary | ICD-10-CM | POA: Insufficient documentation

## 2018-03-12 LAB — COMPREHENSIVE METABOLIC PANEL
ALT: 33 U/L (ref 0–44)
AST: 36 U/L (ref 15–41)
Albumin: 4.4 g/dL (ref 3.5–5.0)
Alkaline Phosphatase: 54 U/L (ref 38–126)
Anion gap: 8 (ref 5–15)
BUN: 26 mg/dL — ABNORMAL HIGH (ref 8–23)
CO2: 25 mmol/L (ref 22–32)
CREATININE: 1.26 mg/dL — AB (ref 0.61–1.24)
Calcium: 9.5 mg/dL (ref 8.9–10.3)
Chloride: 106 mmol/L (ref 98–111)
GFR calc Af Amer: >60 mL/min (ref >60)
GFR calc non Af Amer: 56 mL/min — ABNORMAL LOW (ref >60)
Glucose, Bld: 147 mg/dL — ABNORMAL HIGH (ref 70–99)
Potassium: 4.4 mmol/L (ref 3.5–5.1)
Sodium: 139 mmol/L (ref 135–145)
Total Bilirubin: 0.9 mg/dL (ref 0.3–1.2)
Total Protein: 7.6 g/dL (ref 6.5–8.1)

## 2018-03-12 LAB — CBC
HCT: 41.8 % (ref 39.0–52.0)
Hemoglobin: 13.9 g/dL (ref 13.0–17.0)
MCH: 32.4 pg (ref 26.0–34.0)
MCHC: 33.3 g/dL (ref 30.0–36.0)
MCV: 97.4 fL (ref 80.0–100.0)
Platelets: 198 10*3/uL (ref 150–400)
RBC: 4.29 MIL/uL (ref 4.22–5.81)
RDW: 12.5 % (ref 11.5–15.5)
WBC: 8.7 10*3/uL (ref 4.0–10.5)
nRBC: 0 % (ref 0.0–0.2)

## 2018-03-12 LAB — APTT: APTT: 26 s (ref 24–36)

## 2018-03-12 LAB — PROTIME-INR
INR: 0.9
Prothrombin Time: 12.1 seconds (ref 11.4–15.2)

## 2018-03-12 NOTE — Progress Notes (Addendum)
Spoke face to face with Vincent Kent after midnight, food, clear liquids until 700 am , then po  arrive 1100 am [facility]  driver wife  Requested ekg from [facility] chest xray 02-01-18 on chart/epic No meds to take, fleets enema am 03-19-2018 Had pre op appointment today for cbc, cmet, pt, ptt Has surgery orders in epic

## 2018-03-12 NOTE — Progress Notes (Addendum)
Vincent Kent  03/12/2018      Your procedure is scheduled on 03-19-2018  Report to [place]  1100 at  A.M.  Call this number if you have problems the morning of surgery:[phone]  OUR ADDRESS IS [address], WE ARE LOCATED IN THE [facility] WITH [facility].  CLEAR LIQUIDS FROM MIDNIGHT UNTIL 700 AM DAY OF SURGERY, THEN NOTHING BY MOUTH. FLEETS ENEMA BEFORE YOU LEAVE HOME MORNING OF SURGERY  CLEAR LIQUID DIET   Foods Allowed                                                                     Foods Excluded  Coffee and tea, regular and decaf                             liquids that you cannot  Plain Jell-O in any flavor                                             see through such as: Fruit ices (not with fruit pulp)                                     milk, soups, orange juice  Iced Popsicles                                    All solid food Carbonated beverages, regular and diet                                    Cranberry, grape and apple juices Sports drinks like Gatorade Lightly seasoned clear broth or consume(fat free) Sugar, honey syrup  Sample Menu Breakfast                                Lunch                                     Supper Cranberry juice                    Beef broth                            Chicken broth Jell-O                                     Grape juice                           Apple juice Coffee or tea  Jell-O                                      Popsicle                                                Coffee or tea                        Coffee or tea  _____________________________________________________________________ Driver wife    Remember:   Take these medicines the morning of surgery with A SIP OF WATER: none  Do not wear jewelry, make-up or nail polish.  Do not wear lotions, powders, or perfumes, or deoderant.  Do not shave 48 hours prior to surgery.  Men may shave  face and neck.  Do not bring valuables to the hospital.  [facility] is not responsible for any belongings or valuables.  Contacts, dentures or bridgework may not be worn into surgery.  Leave your suitcase in the car.  After surgery it may be brought to your room.  For patients admitted to the hospital, discharge time will be determined by your treatment team.  Patients discharged the day of surgery will not be allowed to drive home.

## 2018-03-12 NOTE — Progress Notes (Signed)
Records on chart: ekg 01-02-18 [place] medical Records on chart/epic: pt, ptt, cbc, cmet 03-12-18, chest xray 02-01-18 Faxed cmet results to dr Tresa Moore via epic

## 2018-03-18 ENCOUNTER — Telehealth: Payer: Self-pay | Admitting: *Deleted

## 2018-03-18 NOTE — Telephone Encounter (Signed)
CALLED PATIENT TO REMIND OF PROCEDURE FOR 03-19-18, SPOKE WITH PATIENT AND HE IS AWARE OF THIS PROCEDURE.

## 2018-03-19 ENCOUNTER — Encounter (HOSPITAL_BASED_OUTPATIENT_CLINIC_OR_DEPARTMENT_OTHER)
Admission: RE | Disposition: A | Discharge status: Home / Self Care | Payer: Self-pay | Source: Home / Self Care | Attending: Urology

## 2018-03-19 ENCOUNTER — Ambulatory Visit (HOSPITAL_BASED_OUTPATIENT_CLINIC_OR_DEPARTMENT_OTHER): Payer: Medicare Other | Admitting: Anesthesiology

## 2018-03-19 ENCOUNTER — Ambulatory Visit (HOSPITAL_COMMUNITY): Payer: Medicare Other

## 2018-03-19 ENCOUNTER — Ambulatory Visit (HOSPITAL_BASED_OUTPATIENT_CLINIC_OR_DEPARTMENT_OTHER)
Admission: RE | Admit: 2018-03-19 | Discharge: 2018-03-19 | Disposition: A | Discharge status: Home / Self Care | Payer: Medicare Other | Attending: Urology | Admitting: Urology

## 2018-03-19 ENCOUNTER — Encounter (HOSPITAL_BASED_OUTPATIENT_CLINIC_OR_DEPARTMENT_OTHER): Payer: Self-pay

## 2018-03-19 DIAGNOSIS — Z7984 Long term (current) use of oral hypoglycemic drugs: Secondary | ICD-10-CM | POA: Insufficient documentation

## 2018-03-19 DIAGNOSIS — I6529 Occlusion and stenosis of unspecified carotid artery: Secondary | ICD-10-CM | POA: Diagnosis not present

## 2018-03-19 DIAGNOSIS — K760 Fatty (change of) liver, not elsewhere classified: Secondary | ICD-10-CM | POA: Insufficient documentation

## 2018-03-19 DIAGNOSIS — Z888 Allergy status to other drugs, medicaments and biological substances status: Secondary | ICD-10-CM | POA: Insufficient documentation

## 2018-03-19 DIAGNOSIS — M5430 Sciatica, unspecified side: Secondary | ICD-10-CM | POA: Diagnosis not present

## 2018-03-19 DIAGNOSIS — C61 Malignant neoplasm of prostate: Secondary | ICD-10-CM | POA: Insufficient documentation

## 2018-03-19 DIAGNOSIS — Z86010 Personal history of colonic polyps: Secondary | ICD-10-CM | POA: Insufficient documentation

## 2018-03-19 DIAGNOSIS — Z8249 Family history of ischemic heart disease and other diseases of the circulatory system: Secondary | ICD-10-CM | POA: Insufficient documentation

## 2018-03-19 DIAGNOSIS — Z7982 Long term (current) use of aspirin: Secondary | ICD-10-CM | POA: Insufficient documentation

## 2018-03-19 DIAGNOSIS — E785 Hyperlipidemia, unspecified: Secondary | ICD-10-CM | POA: Diagnosis not present

## 2018-03-19 DIAGNOSIS — M5010 Cervical disc disorder with radiculopathy, unspecified cervical region: Secondary | ICD-10-CM | POA: Insufficient documentation

## 2018-03-19 DIAGNOSIS — Z79899 Other long term (current) drug therapy: Secondary | ICD-10-CM | POA: Insufficient documentation

## 2018-03-19 DIAGNOSIS — Z87442 Personal history of urinary calculi: Secondary | ICD-10-CM | POA: Insufficient documentation

## 2018-03-19 DIAGNOSIS — Z823 Family history of stroke: Secondary | ICD-10-CM | POA: Insufficient documentation

## 2018-03-19 DIAGNOSIS — I10 Essential (primary) hypertension: Secondary | ICD-10-CM | POA: Diagnosis not present

## 2018-03-19 DIAGNOSIS — E119 Type 2 diabetes mellitus without complications: Secondary | ICD-10-CM | POA: Insufficient documentation

## 2018-03-19 DIAGNOSIS — Z809 Family history of malignant neoplasm, unspecified: Secondary | ICD-10-CM | POA: Insufficient documentation

## 2018-03-19 DIAGNOSIS — Z820 Family history of epilepsy and other diseases of the nervous system: Secondary | ICD-10-CM | POA: Insufficient documentation

## 2018-03-19 HISTORY — DX: Personal history of urinary calculi: Z87.442

## 2018-03-19 HISTORY — PX: SPACE OAR INSTILLATION: SHX6769

## 2018-03-19 HISTORY — PX: RADIOACTIVE SEED IMPLANT: SHX5150

## 2018-03-19 LAB — GLUCOSE, CAPILLARY
Glucose-Capillary: 130 mg/dL — ABNORMAL HIGH (ref 70–99)
Glucose-Capillary: 115 mg/dL — ABNORMAL HIGH (ref 70–99)

## 2018-03-19 SURGERY — INSERTION, RADIATION SOURCE, PROSTATE
Anesthesia: General

## 2018-03-19 MED ORDER — FENTANYL CITRATE (PF) 100 MCG/2ML IJ SOLN
INTRAMUSCULAR | Status: AC
  Filled 2018-03-19: qty 2

## 2018-03-19 MED ORDER — ACETAMINOPHEN 500 MG PO TABS
1000.0000 mg | ORAL_TABLET | Freq: Once | ORAL | Status: AC
  Administered 2018-03-19: 1000 mg via ORAL
  Filled 2018-03-19: qty 2

## 2018-03-19 MED ORDER — IOHEXOL 300 MG/ML  SOLN
INTRAMUSCULAR | Status: DC | PRN
  Administered 2018-03-19: 7 mL

## 2018-03-19 MED ORDER — ONDANSETRON HCL 4 MG/2ML IJ SOLN
INTRAMUSCULAR | Status: AC
  Filled 2018-03-19: qty 2

## 2018-03-19 MED ORDER — PROPOFOL 10 MG/ML IV BOLUS
INTRAVENOUS | Status: AC
  Filled 2018-03-19: qty 20

## 2018-03-19 MED ORDER — FLEET ENEMA 7-19 GM/118ML RE ENEM
1.0000 | ENEMA | Freq: Once | RECTAL | Status: DC
  Filled 2018-03-19: qty 1

## 2018-03-19 MED ORDER — DEXAMETHASONE SODIUM PHOSPHATE 10 MG/ML IJ SOLN
INTRAMUSCULAR | Status: AC
  Filled 2018-03-19: qty 1

## 2018-03-19 MED ORDER — LIDOCAINE 2% (20 MG/ML) 5 ML SYRINGE
INTRAMUSCULAR | Status: DC | PRN
  Administered 2018-03-19: 80 mg via INTRAVENOUS

## 2018-03-19 MED ORDER — PROPOFOL 10 MG/ML IV BOLUS
INTRAVENOUS | Status: DC | PRN
  Administered 2018-03-19: 20 mg via INTRAVENOUS
  Administered 2018-03-19: 50 mg via INTRAVENOUS
  Administered 2018-03-19: 20 mg via INTRAVENOUS
  Administered 2018-03-19: 40 mg via INTRAVENOUS
  Administered 2018-03-19: 10 mg via INTRAVENOUS
  Administered 2018-03-19: 100 mg via INTRAVENOUS

## 2018-03-19 MED ORDER — ACETAMINOPHEN 500 MG PO TABS
ORAL_TABLET | ORAL | Status: AC
  Filled 2018-03-19: qty 2

## 2018-03-19 MED ORDER — CIPROFLOXACIN IN D5W 400 MG/200ML IV SOLN
INTRAVENOUS | Status: AC
  Filled 2018-03-19: qty 200

## 2018-03-19 MED ORDER — CIPROFLOXACIN IN D5W 400 MG/200ML IV SOLN
400.0000 mg | INTRAVENOUS | Status: AC
  Administered 2018-03-19: 400 mg via INTRAVENOUS
  Filled 2018-03-19: qty 200

## 2018-03-19 MED ORDER — KETOROLAC TROMETHAMINE 30 MG/ML IJ SOLN
INTRAMUSCULAR | Status: DC | PRN
  Administered 2018-03-19: 15 mg via INTRAVENOUS

## 2018-03-19 MED ORDER — SODIUM CHLORIDE FLUSH 0.9 % IV SOLN
INTRAVENOUS | Status: DC | PRN
  Administered 2018-03-19: 10 mL

## 2018-03-19 MED ORDER — SODIUM CHLORIDE 0.9 % IR SOLN
Status: DC | PRN
  Administered 2018-03-19: 1000 mL via INTRAVESICAL

## 2018-03-19 MED ORDER — KETOROLAC TROMETHAMINE 30 MG/ML IJ SOLN
INTRAMUSCULAR | Status: AC
  Filled 2018-03-19: qty 1

## 2018-03-19 MED ORDER — LACTATED RINGERS IV SOLN
INTRAVENOUS | Status: DC
  Administered 2018-03-19 (×2): via INTRAVENOUS
  Filled 2018-03-19: qty 1000

## 2018-03-19 MED ORDER — FENTANYL CITRATE (PF) 100 MCG/2ML IJ SOLN
INTRAMUSCULAR | Status: DC | PRN
  Administered 2018-03-19: 50 ug via INTRAVENOUS
  Administered 2018-03-19 (×2): 25 ug via INTRAVENOUS

## 2018-03-19 MED ORDER — FENTANYL CITRATE (PF) 100 MCG/2ML IJ SOLN
25.0000 ug | INTRAMUSCULAR | Status: DC | PRN
  Filled 2018-03-19: qty 1

## 2018-03-19 MED ORDER — ALFUZOSIN HCL ER 10 MG PO TB24: 10.0000 mg | ORAL_TABLET | Freq: Every day | ORAL | 1 refills | Status: AC

## 2018-03-19 MED ORDER — DEXAMETHASONE SODIUM PHOSPHATE 10 MG/ML IJ SOLN
INTRAMUSCULAR | Status: DC | PRN
  Administered 2018-03-19: 4 mg via INTRAVENOUS

## 2018-03-19 MED ORDER — LIDOCAINE 2% (20 MG/ML) 5 ML SYRINGE
INTRAMUSCULAR | Status: AC
  Filled 2018-03-19: qty 5

## 2018-03-19 MED ORDER — ONDANSETRON HCL 4 MG/2ML IJ SOLN
INTRAMUSCULAR | Status: DC | PRN
  Administered 2018-03-19: 4 mg via INTRAVENOUS

## 2018-03-19 SURGICAL SUPPLY — 49 items
BAG URINE DRAINAGE (UROLOGICAL SUPPLIES) ×3 IMPLANT
BLADE CLIPPER SURG (BLADE) ×3 IMPLANT
CATH FOLEY 2WAY SLVR  5CC 16FR (CATHETERS) ×2
CATH FOLEY 2WAY SLVR 5CC 16FR (CATHETERS) ×1 IMPLANT
CATH ROBINSON RED A/P 16FR (CATHETERS) IMPLANT
CATH ROBINSON RED A/P 20FR (CATHETERS) ×3 IMPLANT
CLOTH BEACON ORANGE TIMEOUT ST (SAFETY) ×3 IMPLANT
CONT SPECI 4OZ STER CLIK (MISCELLANEOUS) ×6 IMPLANT
COVER BACK TABLE 60X90IN (DRAPES) ×3 IMPLANT
COVER MAYO STAND STRL (DRAPES) ×3 IMPLANT
COVER WAND RF STERILE (DRAPES) ×3 IMPLANT
DRSG TEGADERM 4X4.75 (GAUZE/BANDAGES/DRESSINGS) ×3 IMPLANT
DRSG TEGADERM 8X12 (GAUZE/BANDAGES/DRESSINGS) ×6 IMPLANT
GAUZE SPONGE 4X4 12PLY STRL (GAUZE/BANDAGES/DRESSINGS) ×3 IMPLANT
GLOVE BIO SURGEON STRL SZ 6 (GLOVE) IMPLANT
GLOVE BIO SURGEON STRL SZ 6.5 (GLOVE) IMPLANT
GLOVE BIO SURGEON STRL SZ7 (GLOVE) IMPLANT
GLOVE BIO SURGEON STRL SZ7.5 (GLOVE) ×6 IMPLANT
GLOVE BIO SURGEON STRL SZ8 (GLOVE) ×6 IMPLANT
GLOVE BIO SURGEONS STRL SZ 6.5 (GLOVE)
GLOVE BIOGEL PI IND STRL 6 (GLOVE) IMPLANT
GLOVE BIOGEL PI IND STRL 6.5 (GLOVE) IMPLANT
GLOVE BIOGEL PI IND STRL 7.0 (GLOVE) IMPLANT
GLOVE BIOGEL PI IND STRL 8 (GLOVE) ×1 IMPLANT
GLOVE BIOGEL PI INDICATOR 6 (GLOVE)
GLOVE BIOGEL PI INDICATOR 6.5 (GLOVE)
GLOVE BIOGEL PI INDICATOR 7.0 (GLOVE)
GLOVE BIOGEL PI INDICATOR 8 (GLOVE) ×2
GLOVE ECLIPSE 8.0 STRL XLNG CF (GLOVE) ×3 IMPLANT
GOWN STRL REUS W/ TWL LRG LVL3 (GOWN DISPOSABLE) ×1 IMPLANT
GOWN STRL REUS W/ TWL XL LVL3 (GOWN DISPOSABLE) ×2 IMPLANT
GOWN STRL REUS W/TWL LRG LVL3 (GOWN DISPOSABLE) ×2
GOWN STRL REUS W/TWL XL LVL3 (GOWN DISPOSABLE) ×7 IMPLANT
HOLDER FOLEY CATH W/STRAP (MISCELLANEOUS) IMPLANT
I-SEED ×3 IMPLANT
IMPL SPACEOAR SYSTEM 10ML (Spacer) ×1 IMPLANT
IMPLANT SPACEOAR SYSTEM 10ML (Spacer) ×3 IMPLANT
IV NS 1000ML (IV SOLUTION) ×2
IV NS 1000ML BAXH (IV SOLUTION) ×1 IMPLANT
KIT TURNOVER CYSTO (KITS) ×3 IMPLANT
MARKER SKIN DUAL TIP RULER LAB (MISCELLANEOUS) ×3 IMPLANT
PACK CYSTO (CUSTOM PROCEDURE TRAY) ×3 IMPLANT
SURGILUBE 2OZ TUBE FLIPTOP (MISCELLANEOUS) ×3 IMPLANT
SUT BONE WAX W31G (SUTURE) IMPLANT
SYR 10ML LL (SYRINGE) ×3 IMPLANT
TUBE CONNECTING 12'X1/4 (SUCTIONS)
TUBE CONNECTING 12X1/4 (SUCTIONS) IMPLANT
UNDERPAD 30X30 (UNDERPADS AND DIAPERS) ×6 IMPLANT
WATER STERILE IRR 500ML POUR (IV SOLUTION) ×3 IMPLANT

## 2018-03-19 NOTE — Anesthesia Procedure Notes (Signed)
Procedure Name: LMA Insertion Date/Time: 03/19/2018 1:53 PM Performed by: Bonney Aid, CRNA Pre-anesthesia Checklist: Patient identified, Emergency Drugs available, Suction available and Patient being monitored Patient Re-evaluated:Patient Re-evaluated prior to induction Oxygen Delivery Method: Circle system utilized Preoxygenation: Pre-oxygenation with 100% oxygen Induction Type: IV induction Ventilation: Mask ventilation without difficulty LMA: LMA inserted LMA Size: 5.0 Number of attempts: 1 Airway Equipment and Method: Bite block Placement Confirmation: positive ETCO2 Tube secured with: Tape Dental Injury: Teeth and Oropharynx as per pre-operative assessment

## 2018-03-19 NOTE — Op Note (Signed)
Preoperative diagnosis: Stage I (T1cNxMx) Prostate cancer Postoperative diagnosis: Same  Procedure: Prostate brachytherapy seed implant (I-125), Cystoscopy, Biodegradable gel implant Cherre Blanc)  Surgeon: Junious Silk  Radiation oncologist: Tammi Klippel  Anesthesia: Gen.  Indication for procedure: 73 year old with stage I prostate cancer who elected to proceed with prostate brachytherapy.  Findings: On fluoroscopic imaging there was adequate coverage of the prostate. On cystoscopy the urethra appeared normal, the prostatic urethra appeared normal, the trigone and ureteral orifices appeared normal with clear efflux. The bladder mucosa appeared normal. There were no stones, foreign bodies or seeds visualized in the bladder.  Dose: 145 Gy with 30 catheters and 79 active sources    Description of procedure: After consent was obtained patient brought to the operating room. After adequate anesthesia he is placed in lithotomy position and the transrectal ultrasound probe and perineal template positioned. Catheters and brachytherapy seeds were placed per Dr. Johny Shears plan. A total of 30 catheters and 79 active sources (I-125) were placed. The anchoring needles, template and ultrasound were removed.   The 18-gauge needle was then inserted approximately 1 to 2 cm anterior to the anal opening and directed under fluoroscopic guidance into the perirectal fat between the anterior rectal wall and the prostate capsule down to the mid-gland. Midline needle position was confirmed in the sagittal and axial views to verify the tip was in the perirectal fat.  Small amounts of saline were injected to hydrodissect the space between the prostate and the anterior rectal wall.  Axial imaging was viewed to confirm the needle was in the correct location in the mid gland and centered.  Aspiration confirmed no intravascular access.  The saline syringe was carefully disconnected maintaining the desired needle position and the  hydrogel was attached to the needle.  Under ultrasound guidance in the sagittal view a smooth continuous injection was done over about 10 seconds delivering the hydrogel into the space between the prostate and rectal wall.  The needle was withdrawn.  Scout flouro imaging was obtained of the implant. The Foley was removed. Another flouro image was obtained. The patient was prepped again and flexible cystoscopy was performed which was noted to be normal. A 16 French catheter was placed to drain the bladder and removed. He was awakened taken to the recovery room in stable condition.  Complications: None  Blood loss: Minimal  Specimens: None  Drains: None   Disposition: Patient stable to PACU.

## 2018-03-19 NOTE — Discharge Instructions (Signed)
Brachytherapy for Prostate Cancer, Care After ° °This sheet gives you information about how to care for yourself after your procedure. Your health care provider may also give you more specific instructions. If you have problems or questions, contact your health care provider. °What can I expect after the procedure? °After the procedure, it is common to have: °· Trouble passing urine. °· Blood in the urine or semen. °· Constipation. °· Frequent feeling of an urgent need to urinate. °· Bruising, swelling, and tenderness of the area behind the scrotum (perineum). °· Bloating and gas. °· Fatigue. °· Burning or pain in the rectum. °· Problems getting or keeping an erection (erectile dysfunction). °· Nausea. °Follow these instructions at home: °Managing pain, stiffness, and swelling °· If directed, apply ice to the affected area: °? Put ice in a plastic bag. °? Place a towel between your skin and the bag. °? Leave the ice on for 20 minutes, 2-3 times a day. °· Try not to sit directly on the area behind the scrotum. A soft cushion can help with discomfort. °Activity °· Do not drive for 24 hours if you were given a medicine to help you relax (sedative). °· Do not drive or use heavy machinery while taking prescription pain medicine. °· Rest as told by your health care provider. °· Most people can return to normal activities a few days or weeks after the procedure. Ask your health care provider what activities are safe for you. °Eating and drinking °· Drink enough fluid to keep your urine clear or pale yellow. °· Eat a healthy, balanced diet. This includes lean proteins, whole grains, and plenty of fruits and vegetables. °General instructions °· Take over-the-counter and prescription medicines only as told by your health care provider. °· Keep all follow-up visits as told by your health care provider. This is important. You may still need additional treatment. °· Do not take baths, swim, or use a hot tub until your health  care provider approves. Shower and wash the area behind the scrotum gently. °· Do not have sex for one week after the treatment, or until your health care provider approves. °· If you have permanent, low-dose brachytherapy implants: °? Limit close contact with children and pregnant women for 2 months or as told by your health care provider. This is important because of the radiation that is still active in the prostate. °? You may set off radioactive sensors, such as airport screenings. Ask your health care provider for a document that explains your treatment. °? You may be instructed to use a condom during sex for the first 2 months after low-dose brachytherapy. °Contact a health care provider if: °· You have a fever or chills. °· You do not have a bowel movement for 3-4 days after the procedure. °· You have diarrhea for 3-4 days after the procedure. °· You develop any new symptoms, such as problems with urinating or erectile dysfunction. °· You have abdomen (abdominal) pain. °· You have more blood in your urine. °Get help right away if: °· You cannot urinate. °· There is excessive bleeding from your rectum. °· You have unusual drainage coming from your rectum. °· You have severe pain in the treated area that does not go away with pain medicine. °· You have severe nausea or vomiting. °Summary °· If you have permanent, low-dose brachytherapy implants, limit close contact with children and pregnant women for 2 months or as told by your health care provider. This is important because of the radiation   that is still active in the prostate.  Talk with your health care provider about your risk of brachytherapy side effects, such as erectile dysfunction or urinary problems. Your health care provider will be able to recommend possible treatment options.  Keep all follow-up visits as told by your health care provider. This is important. You may need additional treatment. This information is not intended to replace  advice given to you by your health care provider. Make sure you discuss any questions you have with your health care provider. Document Released: 03/04/2010 Document Revised: 03/03/2016 Document Reviewed: 03/03/2016 Elsevier Interactive Patient Education  2019 [facility] Instructions  Activity: Get plenty of rest for the remainder of the day. A responsible individual must stay with you for 24 hours following the procedure.  For the next 24 hours, DO NOT: -Drive a car -Paediatric nurse -Drink alcoholic beverages -Take any medication unless instructed by your physician -Make any legal decisions or sign important papers.  Meals: Start with liquid foods such as gelatin or soup. Progress to regular foods as tolerated. Avoid greasy, spicy, heavy foods. If nausea and/or vomiting occur, drink only clear liquids until the nausea and/or vomiting subsides. Call your physician if vomiting continues.  Special Instructions/Symptoms: Your throat may feel dry or sore from the anesthesia or the breathing tube placed in your throat during surgery. If this causes discomfort, gargle with warm salt water. The discomfort should disappear within 24 hours.  If you had a scopolamine patch placed behind your ear for the management of post- operative nausea and/or vomiting:  1. The medication in the patch is effective for 72 hours, after which it should be removed.  Wrap patch in a tissue and discard in the trash. Wash hands thoroughly with soap and water. 2. You may remove the patch earlier than 72 hours if you experience unpleasant side effects which may include dry mouth, dizziness or visual disturbances. 3. Avoid touching the patch. Wash your hands with soap and water after contact with the patch.

## 2018-03-19 NOTE — Transfer of Care (Signed)
Immediate Anesthesia Transfer of Care Note  Patient: Vincent Kent  Procedure(s) Performed: RADIOACTIVE SEED IMPLANT/BRACHYTHERAPY IMPLANT (N/A ) SPACE OAR INSTILLATION (N/A )  Patient Location: PACU  Anesthesia Type:General  Level of Consciousness: sedated  Airway & Oxygen Therapy: Patient Spontanous Breathing and Patient connected to nasal cannula oxygen  Post-op Assessment: Report given to RN  Post vital signs: Reviewed and stable  Last Vitals:  Vitals Value Taken Time  BP 139/87 03/19/2018  3:30 PM  Temp    Pulse 82 03/19/2018  3:32 PM  Resp    SpO2 97 % 03/19/2018  3:32 PM  Vitals shown include unvalidated device data.  Last Pain:  Vitals:   03/19/18 1128  TempSrc:   PainSc: 0-No pain      Patients Stated Pain Goal: 5 (69/45/03 8882)  Complications: No apparent anesthesia complications

## 2018-03-19 NOTE — H&P (Addendum)
H&P  Chief Complaint: Prostate cancer   History of Present Illness: 73 yo WM with prostate cancer consisting of PSA 8.86, normal digital rectal exam, 38 g prostate, Gleason 4+3 = 7 disease in 2 of 12 cores at the left mid to left apex with 10 to 20% of the cores positive.  He is brought today for brachytherapy and has been well.  No dysuria or gross hematuria.  No cough, colds or congestion.  No fever.  Past Medical History:  Diagnosis Date  . Carotid stenosis   . Cervical disc disease   . Cervical radiculopathy   . Diabetes mellitus    type 2  . Diabetes mellitus without complication ([place])   . Erectile dysfunction   . Hepatic steatosis   . History of kidney stones   . Hyperlipidemia   . Hypertension   . Pelvic fracture ([place]) 2005   acetabulum fx also  . Polyp of colon, adenomatous 10/27/2011   August 2013 - for f/u colonoscopy at 3 yrs  . Prostate cancer ([place])   . Sciatica 07/17/2011   Past Surgical History:  Procedure Laterality Date  . bone spur  2012   cervical bone spur removal  . HERNIA REPAIR    . INGUINAL HERNIA REPAIR Bilateral 1990   left x 2,right 1  . PROSTATE BIOPSY    . TONSILLECTOMY  1949    Home Medications:  Medications Prior to Admission  Medication Sig Dispense Refill Last Dose  . atorvastatin (LIPITOR) 10 MG tablet Take 1 tablet (10 mg total) by mouth daily. (Patient taking differently: Take 10 mg by mouth at bedtime. ) 90 tablet 3 03/18/2018 at Unknown time  . glipiZIDE (GLUCOTROL XL) 5 MG 24 hr tablet Take 5 mg by mouth daily with breakfast.   03/18/2018 at Unknown time  . glucose blood (ONE TOUCH ULTRA TEST) test strip Use as directed twice daily as directed.  Diagnosis code 250.02 300 each 3 03/18/2018 at Unknown time  . losartan (COZAAR) 25 MG tablet Take 25 mg by mouth at bedtime.    03/18/2018 at Unknown time  . potassium citrate (UROCIT-K) 10 MEQ (1080 MG) SR tablet Take 15 mEq by mouth 2 (two) times daily.    03/18/2018 at Unknown time  .  sitaGLIPtan-metformin (JANUMET) 50-1000 MG per tablet Take 1 tablet by mouth 2 (two) times daily with a meal. 180 tablet 3 03/18/2018 at Unknown time  . aspirin 81 MG tablet Take 81 mg by mouth daily.   03/13/2018  . tadalafil (CIALIS) 20 MG tablet Take 1 tablet (20 mg total) by mouth daily as needed for erectile dysfunction. 6 monthly 18 tablet 3 More than a month at Unknown time   Allergies:  Allergies  Allergen Reactions  . Flomax [Tamsulosin Hcl] Other (See Comments)    Syncope and passed out  . Viagra [Sildenafil Citrate] Other (See Comments)    headache    Family History  Problem Relation Age of Onset  . Heart disease Mother   . Stroke Mother   . Cancer Mother        type unknown  . Dementia Mother   . Heart disease Father   . Colon cancer Neg Hx    Social History:  reports that he has never smoked. He has never used smokeless tobacco. He reports current alcohol use. He reports that he does not use drugs.  ROS: A complete review of systems was performed.  All systems are negative except for pertinent findings as noted.  Review of Systems  All other systems reviewed and are negative.    Physical Exam:  Vital signs in last 24 hours: Temp:  [98 F (36.7 C)] 98 F (36.7 C) (02/04 1108) Pulse Rate:  [69] 69 (02/04 1108) Resp:  [16] 16 (02/04 1108) BP: (148)/(86) 148/86 (02/04 1108) SpO2:  [100 %] 100 % (02/04 1108) Weight:  [75.2 kg] 75.2 kg (02/04 1108) General:  Alert and oriented, No acute distress HEENT: Normocephalic, atraumatic Cardiovascular: Regular rate and rhythm Lungs: Regular rate and effort Abdomen: Soft, nontender, nondistended, no abdominal masses Back: No CVA tenderness Extremities: No edema Neurologic: Grossly intact  Laboratory Data:  Results for orders placed or performed during the hospital encounter of 03/19/18 (from the past 24 hour(s))  Glucose, capillary     Status: Abnormal   Collection Time: 03/19/18 11:50 AM  Result Value Ref Range    Glucose-Capillary 130 (H) 70 - 99 mg/dL   No results found for this or any previous visit (from the past 240 hour(s)). Creatinine: No results for input(s): CREATININE in the last 168 hours.  Impression/Assessment:  Prostate cancer   Plan:  I discussed with the patient and his wife the nature, potential benefits, risks and alternatives to prostate brachytherapy, cystoscopy, SpaceOar gel injection, including side effects of the proposed treatment, the likelihood of the patient achieving the goals of the procedure, and any potential problems that might occur during the procedure or recuperation. All questions answered. Patient elects to proceed.    Festus Aloe 03/19/2018, 12:31 PM

## 2018-03-19 NOTE — Anesthesia Preprocedure Evaluation (Addendum)
Anesthesia Evaluation  Patient identified by MRN, date of birth, ID band Patient awake    Reviewed: Allergy & Precautions, NPO status , Patient's Chart, lab work & pertinent test results  Airway Mallampati: I  TM Distance: >3 FB Neck ROM: Full    Dental no notable dental hx. (+) Teeth Intact, Dental Advisory Given   Pulmonary neg pulmonary ROS,    Pulmonary exam normal breath sounds clear to auscultation       Cardiovascular hypertension, Pt. on medications negative cardio ROS Normal cardiovascular exam Rhythm:Regular Rate:Normal  H/o carotid stenosis Doppler 2010 less than 50% stenosis  TTE 2013 EF 60-65%, no valvular abnormalities   Neuro/Psych negative neurological ROS  negative psych ROS   GI/Hepatic negative GI ROS, Neg liver ROS,   Endo/Other  negative endocrine ROSdiabetes, Type 2, Oral Hypoglycemic Agents  Renal/GU negative Renal ROS  negative genitourinary   Musculoskeletal negative musculoskeletal ROS (+)   Abdominal   Peds  Hematology negative hematology ROS (+)   Anesthesia Other Findings Prostate cancer  Reproductive/Obstetrics negative OB ROS                            Anesthesia Physical Anesthesia Plan  ASA: II  Anesthesia Plan: General   Post-op Pain Management:    Induction: Intravenous  PONV Risk Score and Plan: 2 and Dexamethasone and Ondansetron  Airway Management Planned: LMA and Oral ETT  Additional Equipment:   Intra-op Plan:   Post-operative Plan: Extubation in OR  Informed Consent: I have reviewed the patients History and Physical, chart, labs and discussed the procedure including the risks, benefits and alternatives for the proposed anesthesia with the patient or authorized representative who has indicated his/her understanding and acceptance.     Dental advisory given  Plan Discussed with: CRNA  Anesthesia Plan Comments:          Anesthesia Quick Evaluation

## 2018-03-20 ENCOUNTER — Encounter (HOSPITAL_BASED_OUTPATIENT_CLINIC_OR_DEPARTMENT_OTHER): Payer: Self-pay | Admitting: Urology

## 2018-03-20 NOTE — Anesthesia Postprocedure Evaluation (Signed)
Anesthesia Post Note  Patient: LEMARIO CHAIKIN  Procedure(s) Performed: RADIOACTIVE SEED IMPLANT/BRACHYTHERAPY IMPLANT (N/A ) SPACE OAR INSTILLATION (N/A )     Patient location during evaluation: PACU Anesthesia Type: General Level of consciousness: awake and alert Pain management: pain level controlled Vital Signs Assessment: post-procedure vital signs reviewed and stable Respiratory status: spontaneous breathing, nonlabored ventilation, respiratory function stable and patient connected to nasal cannula oxygen Cardiovascular status: blood pressure returned to baseline and stable Postop Assessment: no apparent nausea or vomiting Anesthetic complications: no    Last Vitals:  Vitals:   03/19/18 1645 03/19/18 1741  BP:  (!) 141/75  Pulse: 66 68  Resp: 10 14  Temp:  36.7 C  SpO2: 94% 98%    Last Pain:  Vitals:   03/19/18 1741  TempSrc:   PainSc: 0-No pain                 Eren Puebla S

## 2018-03-21 ENCOUNTER — Telehealth: Payer: Self-pay | Admitting: *Deleted

## 2018-03-21 NOTE — Progress Notes (Signed)
  Radiation Oncology         [phone] ________________________________  Name: Vincent Kent MRN: [id number]  Date: 03/21/2018  DOB: 04-29-1945       Prostate Seed Implant  XN:[id number], Thayer Jew, MD  No ref. provider found  DIAGNOSIS: 73 y.o. 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    ICD-10-CM   1. Malignant neoplasm of prostate [facility]) [place] Discharge patient    PROCEDURE: Insertion of radioactive I-125 seeds into the prostate gland.  RADIATION DOSE: 145 Gy, definitive therapy.  TECHNIQUE: Vincent Kent was brought to the operating room with the urologist. He was placed in the dorsolithotomy position. He was catheterized and a rectal tube was inserted. The perineum was shaved, prepped and draped. The ultrasound probe was then introduced into the rectum to see the prostate gland.  TREATMENT DEVICE: A needle grid was attached to the ultrasound probe stand and anchor needles were placed.  3D PLANNING: The prostate was imaged in 3D using a sagittal sweep of the prostate probe. These images were transferred to the planning computer. There, the prostate, urethra and rectum were defined on each axial reconstructed image. Then, the software created an optimized 3D plan and a few seed positions were adjusted. The quality of the plan was reviewed using [facility] information for the target and the following two organs at risk:  Urethra and Rectum.  Then the accepted plan was printed and handed off to the radiation therapist.  Under my supervision, the custom loading of the seeds and spacers was carried out and loaded into sealed vicryl sleeves.  These pre-loaded needles were then placed into the needle holder.Marland Kitchen  PROSTATE VOLUME STUDY:  Using transrectal ultrasound the volume of the prostate was verified to be 46 cc.  SPECIAL TREATMENT PROCEDURE/SUPERVISION AND HANDLING: The pre-loaded needles were then delivered under sagittal  guidance. A total of 30 needles were used to deposit 79 seeds in the prostate gland. The individual seed activity was 0.461 mCi.  SpaceOAR:  Yes  COMPLEX SIMULATION: At the end of the procedure, an anterior radiograph of the pelvis was obtained to document seed positioning and count. Cystoscopy was performed to check the urethra and bladder.  MICRODOSIMETRY: At the end of the procedure, the patient was emitting 0.21 mR/hr at 1 meter. Accordingly, he was considered safe for hospital discharge.  PLAN: The patient will return to the radiation oncology clinic for post implant CT dosimetry in three weeks.   ________________________________  Sheral Apley Tammi Klippel, M.D.

## 2018-03-21 NOTE — Telephone Encounter (Signed)
Returned patient's phone call, spoke with patient 

## 2018-04-02 ENCOUNTER — Telehealth: Payer: Self-pay | Admitting: *Deleted

## 2018-04-02 DIAGNOSIS — C61 Malignant neoplasm of prostate: Secondary | ICD-10-CM | POA: Diagnosis not present

## 2018-04-02 NOTE — Telephone Encounter (Signed)
CALLED PATIENT TO REMIND OF POST SEED APPTS. AND MRI FOR 04-03-18, SPOKE WITH PATIENT AND HE IS AWARE OF THESE APPTS.

## 2018-04-03 ENCOUNTER — Ambulatory Visit (HOSPITAL_COMMUNITY)
Admission: RE | Admit: 2018-04-03 | Discharge: 2018-04-03 | Disposition: A | Discharge status: Home / Self Care | Payer: Medicare Other | Source: Ambulatory Visit | Attending: Urology | Admitting: Urology

## 2018-04-03 ENCOUNTER — Ambulatory Visit
Admission: RE | Admit: 2018-04-03 | Discharge: 2018-04-03 | Disposition: A | Discharge status: Home / Self Care | Payer: Medicare Other | Source: Ambulatory Visit | Attending: Urology | Admitting: Urology

## 2018-04-03 ENCOUNTER — Encounter: Payer: Self-pay | Admitting: Urology

## 2018-04-03 ENCOUNTER — Ambulatory Visit
Admission: RE | Admit: 2018-04-03 | Discharge: 2018-04-03 | Disposition: A | Discharge status: Home / Self Care | Payer: Medicare Other | Source: Ambulatory Visit | Attending: Radiation Oncology | Admitting: Radiation Oncology

## 2018-04-03 ENCOUNTER — Other Ambulatory Visit: Payer: Self-pay

## 2018-04-03 VITALS — BP 122/74 | HR 82 | Temp 98.2°F | Resp 18 | Wt 169.0 lb

## 2018-04-03 DIAGNOSIS — Z923 Personal history of irradiation: Secondary | ICD-10-CM | POA: Diagnosis not present

## 2018-04-03 DIAGNOSIS — R350 Frequency of micturition: Secondary | ICD-10-CM | POA: Diagnosis not present

## 2018-04-03 DIAGNOSIS — R3911 Hesitancy of micturition: Secondary | ICD-10-CM | POA: Diagnosis not present

## 2018-04-03 DIAGNOSIS — C61 Malignant neoplasm of prostate: Secondary | ICD-10-CM | POA: Insufficient documentation

## 2018-04-03 DIAGNOSIS — Z7982 Long term (current) use of aspirin: Secondary | ICD-10-CM | POA: Diagnosis not present

## 2018-04-03 DIAGNOSIS — Z79899 Other long term (current) drug therapy: Secondary | ICD-10-CM | POA: Diagnosis not present

## 2018-04-03 NOTE — Progress Notes (Signed)
Radiation Oncology         [phone] ________________________________  Name: Vincent Kent MRN: [id number]  Date: 04/03/2018  DOB: [birth year]  Post-Seed Follow-Up Visit Note  CC: Vincent Pretty, MD  Vincent Aloe, MD  Diagnosis:   73 y.o. 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    ICD-10-CM   1. Malignant neoplasm of prostate ([facility]) C61     Interval Since Last Radiation:  2 weeks 03/19/18:  Insertion of radioactive I-125 seeds into the prostate gland; 145 Gy, definitive therapy with placement of SpaceOAR gel.  Narrative:  The patient returns today for routine follow-up.  He is complaining of increased urinary frequency, urgency and urinary hesitation symptoms. He specifically denies dysuria, gross hematuria, weak stream, incomplete bladder emptying or incontinence.  He filled out a questionnaire regarding urinary function today providing and overall IPSS score of 4 characterizing his symptoms as mild.  His pre-implant score was 7.  He is taking Uroxatrol daily and did have a recent episode of orthostatic hypotension this morning causing him to fall in his bedroom.  He denies any abdominal pain, nausea, vomiting, diarrhea or constipation.  He reports a healthy appetite and is maintaining his weight.  He has not noted any significant impact on his energy.  ALLERGIES:  is allergic to flomax [tamsulosin hcl] and viagra [sildenafil citrate].  Meds: Current Outpatient Medications  Medication Sig Dispense Refill  . alfuzosin (UROXATRAL) 10 MG 24 hr tablet Take 1 tablet (10 mg total) by mouth daily with breakfast. 30 tablet 1  . aspirin 81 MG tablet Take 81 mg by mouth daily.    Marland Kitchen atorvastatin (LIPITOR) 10 MG tablet Take 1 tablet (10 mg total) by mouth daily. (Patient taking differently: Take 10 mg by mouth at bedtime. ) 90 tablet 3  . glipiZIDE (GLUCOTROL XL) 5 MG 24 hr tablet Take 5 mg by mouth daily with breakfast.    .  glucose blood (ONE TOUCH ULTRA TEST) test strip Use as directed twice daily as directed.  Diagnosis code 250.02 300 each 3  . losartan (COZAAR) 25 MG tablet Take 25 mg by mouth at bedtime.     . potassium citrate (UROCIT-K) 10 MEQ (1080 MG) SR tablet Take 15 mEq by mouth 2 (two) times daily.     . tadalafil (CIALIS) 20 MG tablet Take 1 tablet (20 mg total) by mouth daily as needed for erectile dysfunction. 6 monthly 18 tablet 3  . sitaGLIPtan-metformin (JANUMET) 50-1000 MG per tablet Take 1 tablet by mouth 2 (two) times daily with a meal. 180 tablet 3   No current facility-administered medications for this encounter.     Physical Findings: In general this is a well appearing Caucasian male in no acute distress.  He's alert and oriented x4 and appropriate throughout the examination. Cardiopulmonary assessment is negative for acute distress and he exhibits normal effort.   Lab Findings: Lab Results  Component Value Date   WBC 8.7 03/12/2018   HGB 13.9 03/12/2018   HCT 41.8 03/12/2018   MCV 97.4 03/12/2018   PLT 198 03/12/2018    Radiographic Findings:  Patient underwent CT imaging in our clinic for post implant dosimetry. The CT will be reviewed by Dr. Tammi Klippel to confirm an adequate distribution of radioactive seeds throughout the prostate gland and ensure that there are no seeds in or near the rectum.  He is scheduled for an MRI of the prostate later today and  these images will be fused with his CT images for further evaluation.  We suspect the final radiation plan and dosimetry will show appropriate coverage of the prostate gland.   Impression/Plan: 73 y.o. 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 The patient is recovering from the effects of radiation. His urinary symptoms should gradually improve over the next 4-6 months. We talked about this today. He is encouraged by his improvement already and is otherwise pleased with his  outcome. We also talked about long-term follow-up for prostate cancer following seed implant. He understands that ongoing PSA determinations and digital rectal exams will help perform surveillance to rule out disease recurrence. He has a follow up appointment scheduled with Dr. Junious Silk in 06/2018. He understands what to expect with his PSA measures. Patient was also educated today about some of the long-term effects from radiation including a small risk for rectal bleeding and possibly erectile dysfunction. We talked about some of the general management approaches to these potential complications. However, I did encourage the patient to contact our office or return at any point if he has questions or concerns related to his previous radiation and prostate cancer.    Nicholos Johns, PA-C

## 2018-04-03 NOTE — Progress Notes (Signed)
Pt here today for a post seed appointment. Pt has and MRI today. Pt has an appointment with [facility] on May 19th. Pt denies having dysuria or hematuria. Pt states having urgency. Pt states that he is emptying his bladder completely.  BP 122/74   Pulse 82   Temp 98.2 F (36.8 C) (Oral)   Resp 18   Wt 169 lb (76.7 kg)   SpO2 99%   BMI 24.96 kg/m    Wt Readings from Last 3 Encounters:  04/03/18 169 lb (76.7 kg)  03/19/18 165 lb 11.2 oz (75.2 kg)  11/29/17 168 lb 8 oz (76.4 kg)

## 2018-04-04 ENCOUNTER — Ambulatory Visit: Payer: Self-pay | Admitting: Urology

## 2018-04-04 ENCOUNTER — Ambulatory Visit: Payer: Medicare Other

## 2018-04-08 DIAGNOSIS — I10 Essential (primary) hypertension: Secondary | ICD-10-CM | POA: Diagnosis not present

## 2018-04-08 DIAGNOSIS — E119 Type 2 diabetes mellitus without complications: Secondary | ICD-10-CM | POA: Diagnosis not present

## 2018-04-14 NOTE — Progress Notes (Signed)
  Radiation Oncology         [phone] ________________________________  Name: Vincent Kent MRN: [id number]  Date: 04/03/2018  DOB: 1945/11/14  COMPLEX SIMULATION NOTE  NARRATIVE:  The patient was brought to the [facility] today following prostate seed implantation approximately one month ago.  Identity was confirmed.  All relevant records and images related to the planned course of therapy were reviewed.  Then, the patient was set-up supine.  CT images were obtained.  The CT images were loaded into the planning software.  Then the prostate and rectum were contoured.  Treatment planning then occurred.  The implanted iodine 125 seeds were identified by the physics staff for projection of radiation distribution  I have requested : 3D Simulation  I have requested a DVH of the following structures: Prostate and rectum.    ________________________________  Sheral Apley Tammi Klippel, M.D.

## 2018-04-22 ENCOUNTER — Encounter: Payer: Self-pay | Admitting: Radiation Oncology

## 2018-04-22 ENCOUNTER — Ambulatory Visit
Admission: RE | Admit: 2018-04-22 | Discharge: 2018-04-22 | Disposition: A | Discharge status: Home / Self Care | Payer: Medicare Other | Source: Ambulatory Visit | Attending: Radiation Oncology | Admitting: Radiation Oncology

## 2018-04-22 DIAGNOSIS — C61 Malignant neoplasm of prostate: Secondary | ICD-10-CM | POA: Diagnosis not present

## 2018-05-12 NOTE — Progress Notes (Signed)
  Radiation Oncology         [phone] ________________________________  Name: Vincent Kent MRN: [id number]  Date: 04/22/2018  DOB: 12/13/1945  3D Planning Note   Prostate Brachytherapy Post-Implant Dosimetry  Diagnosis: 73 y.o. gentleman with Stage T1c adenocarcinoma of the prostate with Gleason score of 4+3, and PSA of 7.58  Narrative: On a previous date, Vincent Kent returned following prostate seed implantation for post implant planning. He underwent CT scan complex simulation to delineate the three-dimensional structures of the pelvis and demonstrate the radiation distribution.  Since that time, the seed localization, and complex isodose planning with dose volume histograms have now been completed.  Results:   Prostate Coverage - The dose of radiation delivered to the 90% or more of the prostate gland (D90) was 92.59% of the prescription dose. This exceeds our goal of greater than 90%. Rectal Sparing - The volume of rectal tissue receiving the prescription dose or higher was 0.0 cc. This falls under our thresholds tolerance of 1.0 cc.  Impression: The prostate seed implant appears to show adequate target coverage and appropriate rectal sparing.  Plan:  The patient will continue to follow with urology for ongoing PSA determinations. I would anticipate a high likelihood for local tumor control with minimal risk for rectal morbidity.  ________________________________  Sheral Apley Tammi Klippel, M.D.

## 2018-05-23 DIAGNOSIS — I10 Essential (primary) hypertension: Secondary | ICD-10-CM | POA: Diagnosis not present

## 2018-05-23 DIAGNOSIS — E119 Type 2 diabetes mellitus without complications: Secondary | ICD-10-CM | POA: Diagnosis not present

## 2018-05-23 DIAGNOSIS — E7800 Pure hypercholesterolemia, unspecified: Secondary | ICD-10-CM | POA: Diagnosis not present

## 2018-06-03 ENCOUNTER — Other Ambulatory Visit (HOSPITAL_COMMUNITY): Payer: Medicare Other

## 2018-06-25 DIAGNOSIS — C61 Malignant neoplasm of prostate: Secondary | ICD-10-CM | POA: Diagnosis not present

## 2018-07-02 DIAGNOSIS — N401 Enlarged prostate with lower urinary tract symptoms: Secondary | ICD-10-CM | POA: Diagnosis not present

## 2018-07-02 DIAGNOSIS — R350 Frequency of micturition: Secondary | ICD-10-CM | POA: Diagnosis not present

## 2018-07-02 DIAGNOSIS — C61 Malignant neoplasm of prostate: Secondary | ICD-10-CM | POA: Diagnosis not present

## 2018-07-19 DIAGNOSIS — B880 Other acariasis: Secondary | ICD-10-CM | POA: Diagnosis not present

## 2018-08-26 DIAGNOSIS — E7800 Pure hypercholesterolemia, unspecified: Secondary | ICD-10-CM | POA: Diagnosis not present

## 2018-08-26 DIAGNOSIS — I10 Essential (primary) hypertension: Secondary | ICD-10-CM | POA: Diagnosis not present

## 2018-08-26 DIAGNOSIS — E119 Type 2 diabetes mellitus without complications: Secondary | ICD-10-CM | POA: Diagnosis not present

## 2018-08-28 DIAGNOSIS — I10 Essential (primary) hypertension: Secondary | ICD-10-CM | POA: Diagnosis not present

## 2018-08-28 DIAGNOSIS — E119 Type 2 diabetes mellitus without complications: Secondary | ICD-10-CM | POA: Diagnosis not present

## 2018-08-28 DIAGNOSIS — E7800 Pure hypercholesterolemia, unspecified: Secondary | ICD-10-CM | POA: Diagnosis not present

## 2018-08-28 DIAGNOSIS — I6529 Occlusion and stenosis of unspecified carotid artery: Secondary | ICD-10-CM | POA: Diagnosis not present

## 2018-09-23 DIAGNOSIS — C61 Malignant neoplasm of prostate: Secondary | ICD-10-CM | POA: Diagnosis not present

## 2018-09-30 DIAGNOSIS — R350 Frequency of micturition: Secondary | ICD-10-CM | POA: Diagnosis not present

## 2018-09-30 DIAGNOSIS — Z8546 Personal history of malignant neoplasm of prostate: Secondary | ICD-10-CM | POA: Diagnosis not present

## 2018-09-30 DIAGNOSIS — N401 Enlarged prostate with lower urinary tract symptoms: Secondary | ICD-10-CM | POA: Diagnosis not present

## 2018-10-03 DIAGNOSIS — M21961 Unspecified acquired deformity of right lower leg: Secondary | ICD-10-CM | POA: Diagnosis not present

## 2018-10-03 DIAGNOSIS — L602 Onychogryphosis: Secondary | ICD-10-CM | POA: Diagnosis not present

## 2018-10-03 DIAGNOSIS — M21962 Unspecified acquired deformity of left lower leg: Secondary | ICD-10-CM | POA: Diagnosis not present

## 2018-10-03 DIAGNOSIS — E139 Other specified diabetes mellitus without complications: Secondary | ICD-10-CM | POA: Diagnosis not present

## 2018-12-05 DIAGNOSIS — L03032 Cellulitis of left toe: Secondary | ICD-10-CM | POA: Diagnosis not present

## 2018-12-19 DIAGNOSIS — L03032 Cellulitis of left toe: Secondary | ICD-10-CM | POA: Diagnosis not present

## 2018-12-23 ENCOUNTER — Other Ambulatory Visit: Payer: Self-pay

## 2018-12-23 DIAGNOSIS — Z20822 Contact with and (suspected) exposure to covid-19: Secondary | ICD-10-CM

## 2018-12-24 LAB — NOVEL CORONAVIRUS, NAA: SARS-CoV-2, NAA: NOT DETECTED

## 2018-12-30 DIAGNOSIS — E119 Type 2 diabetes mellitus without complications: Secondary | ICD-10-CM | POA: Diagnosis not present

## 2018-12-30 DIAGNOSIS — I10 Essential (primary) hypertension: Secondary | ICD-10-CM | POA: Diagnosis not present

## 2018-12-30 DIAGNOSIS — Z125 Encounter for screening for malignant neoplasm of prostate: Secondary | ICD-10-CM | POA: Diagnosis not present

## 2018-12-30 DIAGNOSIS — E7800 Pure hypercholesterolemia, unspecified: Secondary | ICD-10-CM | POA: Diagnosis not present

## 2019-01-02 DIAGNOSIS — L03032 Cellulitis of left toe: Secondary | ICD-10-CM | POA: Diagnosis not present

## 2019-01-06 ENCOUNTER — Other Ambulatory Visit: Payer: Self-pay

## 2019-01-06 DIAGNOSIS — Z0000 Encounter for general adult medical examination without abnormal findings: Secondary | ICD-10-CM | POA: Diagnosis not present

## 2019-01-06 DIAGNOSIS — M5090 Cervical disc disorder, unspecified, unspecified cervical region: Secondary | ICD-10-CM | POA: Diagnosis not present

## 2019-01-06 DIAGNOSIS — I10 Essential (primary) hypertension: Secondary | ICD-10-CM | POA: Diagnosis not present

## 2019-01-06 DIAGNOSIS — K760 Fatty (change of) liver, not elsewhere classified: Secondary | ICD-10-CM | POA: Diagnosis not present

## 2019-01-06 DIAGNOSIS — Z23 Encounter for immunization: Secondary | ICD-10-CM | POA: Diagnosis not present

## 2019-01-06 DIAGNOSIS — Z20828 Contact with and (suspected) exposure to other viral communicable diseases: Secondary | ICD-10-CM | POA: Diagnosis not present

## 2019-01-06 DIAGNOSIS — N320 Bladder-neck obstruction: Secondary | ICD-10-CM | POA: Diagnosis not present

## 2019-01-06 DIAGNOSIS — E785 Hyperlipidemia, unspecified: Secondary | ICD-10-CM | POA: Diagnosis not present

## 2019-01-06 DIAGNOSIS — E1149 Type 2 diabetes mellitus with other diabetic neurological complication: Secondary | ICD-10-CM | POA: Diagnosis not present

## 2019-01-06 DIAGNOSIS — K862 Cyst of pancreas: Secondary | ICD-10-CM | POA: Diagnosis not present

## 2019-01-06 DIAGNOSIS — Z20822 Contact with and (suspected) exposure to covid-19: Secondary | ICD-10-CM

## 2019-01-06 DIAGNOSIS — C61 Malignant neoplasm of prostate: Secondary | ICD-10-CM | POA: Diagnosis not present

## 2019-01-06 DIAGNOSIS — I6529 Occlusion and stenosis of unspecified carotid artery: Secondary | ICD-10-CM | POA: Diagnosis not present

## 2019-01-08 LAB — NOVEL CORONAVIRUS, NAA: SARS-CoV-2, NAA: NOT DETECTED

## 2019-02-03 ENCOUNTER — Ambulatory Visit: Payer: Medicare Other | Attending: Internal Medicine

## 2019-02-03 DIAGNOSIS — Z20822 Contact with and (suspected) exposure to covid-19: Secondary | ICD-10-CM

## 2019-02-04 LAB — NOVEL CORONAVIRUS, NAA: SARS-CoV-2, NAA: NOT DETECTED

## 2019-02-27 ENCOUNTER — Ambulatory Visit: Payer: Medicare Other | Attending: Internal Medicine

## 2019-02-27 DIAGNOSIS — Z23 Encounter for immunization: Secondary | ICD-10-CM | POA: Diagnosis not present

## 2019-02-27 NOTE — Progress Notes (Signed)
   Covid-19 Vaccination Clinic  Name:  Vincent Kent    MRN: [id number] DOB: 09/10/1945  02/27/2019  [name] was observed post Covid-19 immunization for 15 minutes without incidence. He was provided with Vaccine Information Sheet and instruction to access the V-Safe system.   Mr. Garden was instructed to call 911 with any severe reactions post vaccine: Marland Kitchen Difficulty breathing  . Swelling of your face and throat  . A fast heartbeat  . A bad rash all over your body  . Dizziness and weakness    Immunizations Administered    Name Date Dose VIS Date Route   Pfizer COVID-19 Vaccine 02/27/2019 11:35 AM 0.3 mL 01/24/2019 Intramuscular   Manufacturer: [employer], [employer]   Lot: [id number]   [place]: [id number]

## 2019-03-11 DIAGNOSIS — H2513 Age-related nuclear cataract, bilateral: Secondary | ICD-10-CM | POA: Diagnosis not present

## 2019-03-11 DIAGNOSIS — E119 Type 2 diabetes mellitus without complications: Secondary | ICD-10-CM | POA: Diagnosis not present

## 2019-03-11 DIAGNOSIS — H524 Presbyopia: Secondary | ICD-10-CM | POA: Diagnosis not present

## 2019-03-11 DIAGNOSIS — H25013 Cortical age-related cataract, bilateral: Secondary | ICD-10-CM | POA: Diagnosis not present

## 2019-03-18 DIAGNOSIS — I6529 Occlusion and stenosis of unspecified carotid artery: Secondary | ICD-10-CM | POA: Diagnosis not present

## 2019-03-18 DIAGNOSIS — E1149 Type 2 diabetes mellitus with other diabetic neurological complication: Secondary | ICD-10-CM | POA: Diagnosis not present

## 2019-03-18 DIAGNOSIS — I10 Essential (primary) hypertension: Secondary | ICD-10-CM | POA: Diagnosis not present

## 2019-03-18 DIAGNOSIS — E785 Hyperlipidemia, unspecified: Secondary | ICD-10-CM | POA: Diagnosis not present

## 2019-03-19 ENCOUNTER — Ambulatory Visit: Payer: Medicare Other | Attending: Internal Medicine

## 2019-03-19 DIAGNOSIS — Z23 Encounter for immunization: Secondary | ICD-10-CM | POA: Insufficient documentation

## 2019-03-19 NOTE — Progress Notes (Signed)
   Covid-19 Vaccination Clinic  Name:  Vincent Kent    MRN: [id number] DOB: 1945/11/28  03/19/2019  Vincent Kent was observed post Covid-19 immunization for 15 minutes without incidence. He was provided with Vaccine Information Sheet and instruction to access the V-Safe system.   Vincent Kent was instructed to call 911 with any severe reactions post vaccine: Marland Kitchen Difficulty breathing  . Swelling of your face and throat  . A fast heartbeat  . A bad rash all over your body  . Dizziness and weakness    Immunizations Administered    Name Date Dose VIS Date Route   Pfizer COVID-19 Vaccine 03/19/2019 10:59 AM 0.3 mL 01/24/2019 Intramuscular   Manufacturer: [place]   Lot: [id number]   [place]: [id number]

## 2019-03-26 DIAGNOSIS — Z8546 Personal history of malignant neoplasm of prostate: Secondary | ICD-10-CM | POA: Diagnosis not present

## 2019-04-02 DIAGNOSIS — N401 Enlarged prostate with lower urinary tract symptoms: Secondary | ICD-10-CM | POA: Diagnosis not present

## 2019-04-02 DIAGNOSIS — R350 Frequency of micturition: Secondary | ICD-10-CM | POA: Diagnosis not present

## 2019-04-02 DIAGNOSIS — Z8546 Personal history of malignant neoplasm of prostate: Secondary | ICD-10-CM | POA: Diagnosis not present

## 2019-04-08 DIAGNOSIS — E119 Type 2 diabetes mellitus without complications: Secondary | ICD-10-CM | POA: Diagnosis not present

## 2019-04-10 DIAGNOSIS — H25812 Combined forms of age-related cataract, left eye: Secondary | ICD-10-CM | POA: Diagnosis not present

## 2019-04-10 DIAGNOSIS — H25012 Cortical age-related cataract, left eye: Secondary | ICD-10-CM | POA: Diagnosis not present

## 2019-04-10 DIAGNOSIS — H2512 Age-related nuclear cataract, left eye: Secondary | ICD-10-CM | POA: Diagnosis not present

## 2019-04-10 DIAGNOSIS — H52222 Regular astigmatism, left eye: Secondary | ICD-10-CM | POA: Diagnosis not present

## 2019-06-02 ENCOUNTER — Ambulatory Visit (HOSPITAL_COMMUNITY)
Admission: EM | Admit: 2019-06-02 | Discharge: 2019-06-02 | Disposition: A | Discharge status: Home / Self Care | Payer: Medicare Other | Attending: Internal Medicine | Admitting: Internal Medicine

## 2019-06-02 ENCOUNTER — Other Ambulatory Visit: Payer: Self-pay

## 2019-06-02 DIAGNOSIS — Z8249 Family history of ischemic heart disease and other diseases of the circulatory system: Secondary | ICD-10-CM | POA: Diagnosis not present

## 2019-06-02 DIAGNOSIS — E119 Type 2 diabetes mellitus without complications: Secondary | ICD-10-CM | POA: Insufficient documentation

## 2019-06-02 DIAGNOSIS — R197 Diarrhea, unspecified: Secondary | ICD-10-CM | POA: Insufficient documentation

## 2019-06-02 DIAGNOSIS — T50905A Adverse effect of unspecified drugs, medicaments and biological substances, initial encounter: Secondary | ICD-10-CM | POA: Diagnosis not present

## 2019-06-02 DIAGNOSIS — J069 Acute upper respiratory infection, unspecified: Secondary | ICD-10-CM | POA: Diagnosis not present

## 2019-06-02 DIAGNOSIS — R110 Nausea: Secondary | ICD-10-CM | POA: Diagnosis not present

## 2019-06-02 DIAGNOSIS — Z20822 Contact with and (suspected) exposure to covid-19: Secondary | ICD-10-CM | POA: Diagnosis not present

## 2019-06-02 DIAGNOSIS — R0981 Nasal congestion: Secondary | ICD-10-CM | POA: Diagnosis present

## 2019-06-02 DIAGNOSIS — R109 Unspecified abdominal pain: Secondary | ICD-10-CM | POA: Insufficient documentation

## 2019-06-02 DIAGNOSIS — Z7982 Long term (current) use of aspirin: Secondary | ICD-10-CM | POA: Insufficient documentation

## 2019-06-02 DIAGNOSIS — Z8546 Personal history of malignant neoplasm of prostate: Secondary | ICD-10-CM | POA: Insufficient documentation

## 2019-06-02 DIAGNOSIS — Z7984 Long term (current) use of oral hypoglycemic drugs: Secondary | ICD-10-CM | POA: Diagnosis not present

## 2019-06-02 DIAGNOSIS — I10 Essential (primary) hypertension: Secondary | ICD-10-CM | POA: Diagnosis not present

## 2019-06-02 DIAGNOSIS — E785 Hyperlipidemia, unspecified: Secondary | ICD-10-CM | POA: Insufficient documentation

## 2019-06-02 DIAGNOSIS — Z79899 Other long term (current) drug therapy: Secondary | ICD-10-CM | POA: Insufficient documentation

## 2019-06-02 NOTE — ED Triage Notes (Signed)
Pt c/o runny nose, congestion, cough x 2 days, fully COVID vaccinated but requests to be tested. Pt also c/o diarrhea after starting new diabetic medication.

## 2019-06-03 LAB — SARS CORONAVIRUS 2 (TAT 6-24 HRS): SARS Coronavirus 2: NEGATIVE

## 2019-06-03 NOTE — ED Provider Notes (Signed)
[place]    CSN: [id number] Arrival date & time: 06/02/19  1844      History   Chief Complaint Chief Complaint  Patient presents with  . Nasal Congestion  . Fatigue    HPI SHONTEZ RINDAL is a 74 y.o. male who recently completed COVID-19 vaccination in February comes to urgent care with runny nose, cough and congestion of 2 days duration.  He started having generalized body aches as well.  Patient was started on a new oral hypoglycemic agents recently.  He has been experiencing diarrhea, nausea evidence of abdominal discomfort since he started taking it.  Medication name is semaglutide.  Patient admits to having chills no night sweats or weight loss.   Past Medical History:  Diagnosis Date  . Carotid stenosis   . Cervical disc disease   . Cervical radiculopathy   . Diabetes mellitus    type 2  . Diabetes mellitus without complication ([place])   . Erectile dysfunction   . Hepatic steatosis   . History of kidney stones   . Hyperlipidemia   . Hypertension   . Pelvic fracture ([place]) 2005   acetabulum fx also  . Polyp of colon, adenomatous 10/27/2011   August 2013 - for f/u colonoscopy at 5 yrs  . Prostate cancer ([place])   . Sciatica 07/17/2011    Patient Active Problem List   Diagnosis Date Noted  . Malignant neoplasm of prostate ([place]) 12/03/2017  . Pancreatic cyst 10/31/2013  . Side pain 02/28/2013  . Peripheral neuropathy 09/26/2012  . Toe pain 09/26/2012  . Polyp of colon, adenomatous 10/27/2011  . Microhematuria 07/18/2011  . Sciatica 07/17/2011  . Diabetes mellitus with neuropathy ([place]) 07/17/2011  . 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 Hyperlipidemia   . Hypertension   . Cervical radiculopathy   . Cervical disc disease   . Erectile dysfunction   . Carotid stenosis   . Preventative health care 07/08/2011    Past Surgical History:  Procedure Laterality Date  . bone spur  2012   cervical bone spur removal  . HERNIA REPAIR    . INGUINAL  HERNIA REPAIR Bilateral 1990   left x 2,right 1  . PROSTATE BIOPSY    . RADIOACTIVE SEED IMPLANT N/A 03/19/2018   Procedure: RADIOACTIVE SEED IMPLANT/BRACHYTHERAPY IMPLANT;  Surgeon: Festus Aloe, MD;  Location[facility];  Service: Urology;  Laterality: N/A;  . SPACE OAR INSTILLATION N/A 03/19/2018   Procedure: SPACE OAR INSTILLATION;  Surgeon: Festus Aloe, MD;  Location: [facility];  Service: Urology;  Laterality: N/A;  . TONSILLECTOMY  1949       Home Medications    Prior to Admission medications   Medication Sig Start Date End Date Taking? Authorizing Provider  Semaglutide (RYBELSUS) 7 MG TABS Take by mouth.   Yes [provider]  alfuzosin (UROXATRAL) 10 MG 24 hr tablet Take 1 tablet (10 mg total) by mouth daily with breakfast. 03/19/18   Festus Aloe, MD  aspirin 81 MG tablet Take 81 mg by mouth daily.    [provider]  atorvastatin (LIPITOR) 10 MG tablet Take 1 tablet (10 mg total) by mouth daily. Patient taking differently: Take 10 mg by mouth at bedtime.  03/27/13   Biagio Borg, MD  glucose blood (ONE TOUCH ULTRA TEST) test strip Use as directed twice daily as directed.  Diagnosis code 250.02 09/26/12   Biagio Borg, MD  losartan (COZAAR) 25 MG tablet Take 25 mg by  mouth at bedtime.     [provider]  sitaGLIPtan-metformin (JANUMET) 50-1000 MG per tablet Take 1 tablet by mouth 2 (two) times daily with a meal. 09/26/12 03/19/18  Biagio Borg, MD  tadalafil (CIALIS) 20 MG tablet Take 1 tablet (20 mg total) by mouth daily as needed for erectile dysfunction. 6 monthly 09/26/12   Biagio Borg, MD    Family History Family History  Problem Relation Age of Onset  . Heart disease Mother   . Stroke Mother   . Cancer Mother        type unknown  . Dementia Mother   . Heart disease Father   . Colon cancer Neg Hx     Social History Social History   Tobacco Use  . Smoking status: Never Smoker  . Smokeless  tobacco: Never Used  Substance Use Topics  . Alcohol use: Yes    Comment: bottle of wine q 2 weeks  . Drug use: No     Allergies   Flomax [tamsulosin hcl] and Viagra [sildenafil citrate]   Review of Systems Review of Systems  Constitutional: Positive for activity change, chills and fatigue. Negative for fever.  HENT: Positive for congestion and sore throat.   Eyes: Negative for discharge and itching.  Respiratory: Positive for cough. Negative for shortness of breath and wheezing.   Cardiovascular: Negative for chest pain.  Musculoskeletal: Negative.   Psychiatric/Behavioral: Negative.      Physical Exam Triage Vital Signs ED Triage Vitals  Enc Vitals Group     BP 06/02/19 1923 (!) 167/83     Pulse Rate 06/02/19 1923 93     Resp 06/02/19 1923 16     Temp 06/02/19 1923 99.6 F (37.6 C)     Temp src --      SpO2 06/02/19 1923 99 %     Weight --      Height --      Head Circumference --      Peak Flow --      Pain Score 06/02/19 1924 4     Pain Loc --      Pain Edu? --      Excl. in [place]? --    No data found.  Updated Vital Signs BP (!) 167/83   Pulse 93   Temp 99.6 F (37.6 C)   Resp 16   SpO2 99%   Visual Acuity Right Eye Distance:   Left Eye Distance:   Bilateral Distance:    Right Eye Near:   Left Eye Near:    Bilateral Near:     Physical Exam Vitals and nursing note reviewed.  Constitutional:      Appearance: Normal appearance. He is ill-appearing.  Cardiovascular:     Rate and Rhythm: Normal rate and regular rhythm.  Pulmonary:     Effort: Pulmonary effort is normal.     Breath sounds: Normal breath sounds.  Abdominal:     General: Bowel sounds are normal.     Palpations: Abdomen is soft.  Musculoskeletal:        General: Normal range of motion.     Cervical back: Normal range of motion.  Skin:    Capillary Refill: Capillary refill takes less than 2 seconds.  Neurological:     Mental Status: He is alert.      UC Treatments /  Results  Labs (all labs ordered are listed, but only abnormal results are displayed) Labs Reviewed  SARS CORONAVIRUS 2 (TAT 6-24 HRS)  EKG   Radiology No results found.  Procedures Procedures (including critical care time)  Medications Ordered in UC Medications - No data to display  Initial Impression / Assessment and Plan / UC Course  I have reviewed the triage vital signs and the nursing notes.  Pertinent labs & imaging results that were available during my care of the patient were reviewed by me and considered in my medical decision making (see chart for details).     1. Viral URI with cough: COVID-19 PCR testing sent Patient is advised to quarantine until COVID-19 test results are available Return precautions given Patient is advised to sign up for MyChart if he has not done so. Final Clinical Impressions(s) / UC Diagnoses   Final diagnoses:  Viral URI with cough  Medication side effect, initial encounter   Discharge Instructions   None    ED Prescriptions    None     PDMP not reviewed this encounter.   Chase Picket, MD 06/03/19 [phone]

## 2019-06-04 ENCOUNTER — Other Ambulatory Visit: Payer: Medicare Other

## 2019-06-27 DIAGNOSIS — E1149 Type 2 diabetes mellitus with other diabetic neurological complication: Secondary | ICD-10-CM | POA: Diagnosis not present

## 2019-06-27 DIAGNOSIS — I10 Essential (primary) hypertension: Secondary | ICD-10-CM | POA: Diagnosis not present

## 2019-06-27 DIAGNOSIS — E785 Hyperlipidemia, unspecified: Secondary | ICD-10-CM | POA: Diagnosis not present

## 2019-06-27 DIAGNOSIS — I6529 Occlusion and stenosis of unspecified carotid artery: Secondary | ICD-10-CM | POA: Diagnosis not present

## 2019-06-30 DIAGNOSIS — E1149 Type 2 diabetes mellitus with other diabetic neurological complication: Secondary | ICD-10-CM | POA: Diagnosis not present

## 2019-06-30 DIAGNOSIS — M5430 Sciatica, unspecified side: Secondary | ICD-10-CM | POA: Diagnosis not present

## 2019-06-30 DIAGNOSIS — F411 Generalized anxiety disorder: Secondary | ICD-10-CM | POA: Diagnosis not present

## 2019-07-17 DIAGNOSIS — E785 Hyperlipidemia, unspecified: Secondary | ICD-10-CM | POA: Diagnosis not present

## 2019-07-17 DIAGNOSIS — E1149 Type 2 diabetes mellitus with other diabetic neurological complication: Secondary | ICD-10-CM | POA: Diagnosis not present

## 2019-07-18 ENCOUNTER — Other Ambulatory Visit: Payer: Self-pay

## 2019-07-18 ENCOUNTER — Encounter: Payer: Self-pay | Admitting: Dietician

## 2019-07-18 ENCOUNTER — Encounter: Payer: Medicare Other | Attending: Internal Medicine | Admitting: Dietician

## 2019-07-18 DIAGNOSIS — E1140 Type 2 diabetes mellitus with diabetic neuropathy, unspecified: Secondary | ICD-10-CM | POA: Insufficient documentation

## 2019-07-18 NOTE — Progress Notes (Signed)
Medical Nutrition Therapy:  Appt start time: 1100 end time:  1200.   Assessment:  Primary concerns today: .  Patient is here today alone. He would like to gain weight.  He was last seen by myself 07/08/15 (155 lbs) and has tried some of the ideas for weight gain that we discussed in his last visit such as a granola bars.  He drinks 1/2 Boost Glucose Control. and tolerates small amounts but they make him have a poor appetite for the next meals.  States that he was on xigduo and changed to glipizide and janumet and these stopped working with increased blood glucose.  He had since started Rybelsus with a decrease A1C from 6.4%-6.1% but could not tolerate this at the full dose with diarrhea, indigestion, inability to eat, and weight loss.  He also states that Rybelsus did not decrease his fasting blood glucose which stayed near 200. Rybelsus was discontinued and started on Farxiga which lowered his blood glucose.  He reports losing 14 lbs since starting the Rybelsus and has regained a couple of pounds.  He reports loss of strength and energy as a result of the weight loss. He has extreme thirst on Farxiga.  MD told him to increase his salt intake and patient states that he does not like salt. Wilder Glade changed to [place] (Farxiga and Metformin combo).  He does still have intermittent diarrhea but this is improved. Fasting blood glucose is now about 120.  History includes Type 2 diabetes for >20 years, prostate cancer, kidney stones (recommendation of a high citrate diet per MD), HTN, HLD.  Preferred Learning Style:   No preference indicated   Learning Readiness:   Ready  Change in progress    DIETARY INTAKE:  Usual eating pattern includes 3 meals and 1 snacks per day. Avoided foods include Rice (increase his blood glucose).    24-hr recall:  B ( AM): Special K protein with whole milk   Snk ( AM):   L ( PM): toasted cheese on 2 slices raisin bread OR tomato sandwich on oat nut bread, OR  ham sandwich WITH seasonal fruit and LS potato, chips OR Natural peanut butter on crackers, with fruit Snk ( PM): Kind bar D ( PM): meat, potatoes, vegetables Snk ( PM): occasional ice cream (1 cup) Beverages: 7 1/2 cups water daily, 12 oz regular lemonade every other day which he alternates with the 5 calorie cran grape, occasional black coffee  Usual physical activity: 20 minutes six days per week plus increased gardening throughout the week.  Estimated energy needs: 2000 calories 225 g carbohydrates 125 g protein 67 g fat  Progress Towards Goal(s):  In progress.   Nutritional Diagnosis:  NB-1.1 Food and nutrition-related knowledge deficit As related to balance of carbohydrates, protein, and fat.  As evidenced by diet hx and patient report.    Intervention:  Nutrition counseling related to type 2 diabetes, weight management (to regain), and GI concerns.  Plan: Try taking the xigduo in the middle or right after breakfast rather than before to see if this helps your diarrhea.  Consider 2 oz of pure lemon or lime juice twice per day to increase your citric acid without carbohydrates.  This can be added to water, salads, over greens, etc.   Consider adding additional protein to breakfast  Such as smaller portion of cereal and add toast with peanut butter or an egg.  Consider the Boost Glucose Control with lunch.  Consider snacking on nuts or adding to your  meal to increase calories. Consider 3 meals and 3 snacks daily to gain weight. Add olive oil to vegetables. Adequate carbohydrates with meals.  Aim for around 60 grams per meal and 15-30 grams for a snack.  Teaching Method Utilized:  Visual Auditory  Handouts given during visit include:  Snack sheet  Kidney stone nutrition therapy from AND  Barriers to learning/adherence to lifestyle change: none  Demonstrated degree of understanding via:  Teach Back   Monitoring/Evaluation:  Dietary intake, exercise, and body  weight in 2 month(s).

## 2019-07-18 NOTE — Patient Instructions (Addendum)
Try taking the xigduo in the middle or right after breakfast rather than before to see if this helps your diarrhea.  Consider 2 oz of pure lemon or lime juice twice per day to increase your citric acid without carbohydrates.  This can be added to water, salads, over greens, etc.   Consider adding additional protein to breakfast  Such as smaller portion of cereal and add toast with peanut butter or an egg.  Consider the Boost Glucose Control with lunch.  Consider snacking on nuts or adding to your meal to increase calories. Consider 3 meals and 3 snacks daily to gain weight. Add olive oil to vegetables. Adequate carbohydrates with meals.  Aim for around 60 grams per meal and 15-30 grams for a snack.

## 2019-09-19 ENCOUNTER — Ambulatory Visit: Payer: Medicare Other | Admitting: Dietician

## 2019-09-22 ENCOUNTER — Other Ambulatory Visit: Payer: Self-pay

## 2019-09-22 ENCOUNTER — Encounter: Payer: Medicare Other | Attending: Internal Medicine | Admitting: Dietician

## 2019-09-22 DIAGNOSIS — E1140 Type 2 diabetes mellitus with diabetic neuropathy, unspecified: Secondary | ICD-10-CM | POA: Insufficient documentation

## 2019-09-22 NOTE — Progress Notes (Signed)
Medical Nutrition Therapy:  Appt start time: 1100 end time:  1200.   Assessment:  Primary concerns today: Patient is here today alone.  He has questions regarding breakfast, food labels, and his weight.  He was last seen 07/18/2019 and his weight was 161 lbs at that time.  10 days ago xigduo dose was doubled and states that he went from 180-200 blood sugars to the normal range. Weight today 159 lbs  (lowest weight at home was 153 lbs and was 154 lbs this am). He states that when he first started losing weight he had low energy but states that his energy seems fine now that he is less active due to the heat.  He wonders if he should now be content with his current weight. He had been 168 lbs usually prior to the weight loss. He mows his own lawn. GI symptoms resolved since coming off Rybelsus. He states that breakfast is his heardest meal. He drinks Boost Glucose control some days. GI symptoms are much improved since discontinuation of Rybelsus.  History includes Type 2 diabetes for >20 years, prostate cancer, kidney stones (recommendation of a high citrate diet per MD), HTN, HLD.  Patient lives with his wife who has been newly diagnosed with diabetes and saw  [place], [place] in [place].  Preferred Learning Style:   No preference indicated   Learning Readiness:   Ready  Change in progress    DIETARY INTAKE:  Usual eating pattern includes 3 meals and 1 snacks per day. Avoided foods include Rice (increase his blood glucose).    24-hr recall:  B ( AM): cereal, blueberries or peaches or bananas, 2% milk Snk ( AM):   L ( PM): toasted cheese on 2 slices raisin bread OR tomato sandwich on oat nut bread, OR ham sandwich WITH seasonal fruit and LS potato, chips OR Natural peanut butter on crackers, with fruit Snk ( PM): Kind bar D ( PM): meat, potatoes, vegetables Snk ( PM): occasional ice cream (1 cup) Beverages: 7 1/2 cups water daily, 12 oz regular lemonade every other day which he  alternates with the 5 calorie cran grape, occasional black coffee  Usual physical activity: 20 minutes six days per week plus increased gardening throughout the week.  Estimated energy needs: 2000 calories 225 g carbohydrates 125 g protein 67 g fat  Progress Towards Goal(s):  In progress.   Nutritional Diagnosis:  NB-1.1 Food and nutrition-related knowledge deficit As related to balance of carbohydrates, protein, and fat.  As evidenced by diet hx and patient report.    Intervention:  Nutrition education related to label reading, breakfast ideas, basic meal planning, protein shake ideas and options for exercise.   Breakfast ideas:  Kashi Protein cereal, low fat milk, fruit  Steel cut oats, walnuts, fruit, low fat milk  Yogurt, granola fruit  Egg, 2 slices toast, fruit   Protein shake ideas:  (can blend with 1/2 frozen banana, berries or other frozen fruit)  Orgain plant based ready to drink  Boost Glucose Control  Orgain Simply protein powder    Beverages: True lemon drink mixes  Exercise options  Walk when it is International aid/development worker yoga at the [employer] sit and be fit class  Light weights  Aim for 4-5 Carb Choices per meal (60-75 grams) +/- 1 either way  Aim for 0-2 Carbs per snack if hungry  Include protein in moderation with your meals and snacks Consider reading food labels for Total Carbohydrate of  foods Consider  increasing your activity level by gardening or walking at least 30 minutes daily as tolerated Continuechecking BG at alternate times per day  Continue taking medication as directed by MD  Teaching Method Utilized:  Visual Auditory  Handouts given during visit include:  Snack sheet  Kidney stone nutrition therapy from AND  Barriers to learning/adherence to lifestyle change: none  Demonstrated degree of understanding via:  Teach Back   Monitoring/Evaluation:  Dietary intake, exercise, and body weight in 2 month(s).

## 2019-09-22 NOTE — Patient Instructions (Signed)
Breakfast ideas:  Kashi Protein cereal, low fat milk, fruit  Steel cut oats, walnuts, fruit, low fat milk  Yogurt, granola fruit  Egg, 2 slices toast, fruit   Protein shake (can blend with 1/2 frozen banana, berries or other frozen fruit)  Orgain plant based ready to drink  Boost Glucose Control  Orgain Simply protein powder      Beverages True lemon drink mixes  Exercise options  Walk when it is International aid/development worker yoga at the [employer] sit and be fit class  Light weights  Aim for 4-5 Carb Choices per meal (60-75 grams) +/- 1 either way  Aim for 0-2 Carbs per snack if hungry  Include protein in moderation with your meals and snacks Consider reading food labels for Total Carbohydrate of foods Consider  increasing your activity level by gardening or walking at least 30 minutes daily as tolerated Continuechecking BG at alternate times per day  Continue taking medication as directed by MD

## 2019-10-16 DIAGNOSIS — E785 Hyperlipidemia, unspecified: Secondary | ICD-10-CM | POA: Diagnosis not present

## 2019-10-16 DIAGNOSIS — I10 Essential (primary) hypertension: Secondary | ICD-10-CM | POA: Diagnosis not present

## 2019-10-16 DIAGNOSIS — C61 Malignant neoplasm of prostate: Secondary | ICD-10-CM | POA: Diagnosis not present

## 2019-10-16 DIAGNOSIS — E1149 Type 2 diabetes mellitus with other diabetic neurological complication: Secondary | ICD-10-CM | POA: Diagnosis not present

## 2019-10-16 DIAGNOSIS — R634 Abnormal weight loss: Secondary | ICD-10-CM | POA: Diagnosis not present

## 2019-10-27 DIAGNOSIS — E875 Hyperkalemia: Secondary | ICD-10-CM | POA: Diagnosis not present

## 2019-10-27 DIAGNOSIS — Z23 Encounter for immunization: Secondary | ICD-10-CM | POA: Diagnosis not present

## 2019-10-27 DIAGNOSIS — M7741 Metatarsalgia, right foot: Secondary | ICD-10-CM | POA: Diagnosis not present

## 2019-10-27 DIAGNOSIS — R634 Abnormal weight loss: Secondary | ICD-10-CM | POA: Diagnosis not present

## 2019-10-28 DIAGNOSIS — E875 Hyperkalemia: Secondary | ICD-10-CM | POA: Diagnosis not present

## 2019-11-03 DIAGNOSIS — R634 Abnormal weight loss: Secondary | ICD-10-CM | POA: Diagnosis not present

## 2019-11-10 DIAGNOSIS — M21961 Unspecified acquired deformity of right lower leg: Secondary | ICD-10-CM | POA: Diagnosis not present

## 2019-11-10 DIAGNOSIS — E119 Type 2 diabetes mellitus without complications: Secondary | ICD-10-CM | POA: Diagnosis not present

## 2019-11-10 DIAGNOSIS — G5761 Lesion of plantar nerve, right lower limb: Secondary | ICD-10-CM | POA: Diagnosis not present

## 2019-11-10 DIAGNOSIS — M79671 Pain in right foot: Secondary | ICD-10-CM | POA: Diagnosis not present

## 2019-11-12 DIAGNOSIS — N200 Calculus of kidney: Secondary | ICD-10-CM | POA: Diagnosis not present

## 2019-11-12 DIAGNOSIS — Z23 Encounter for immunization: Secondary | ICD-10-CM | POA: Diagnosis not present

## 2019-11-12 DIAGNOSIS — E119 Type 2 diabetes mellitus without complications: Secondary | ICD-10-CM | POA: Diagnosis not present

## 2019-11-12 DIAGNOSIS — Z6823 Body mass index (BMI) 23.0-23.9, adult: Secondary | ICD-10-CM | POA: Diagnosis not present

## 2019-11-12 DIAGNOSIS — E785 Hyperlipidemia, unspecified: Secondary | ICD-10-CM | POA: Diagnosis not present

## 2019-11-20 ENCOUNTER — Other Ambulatory Visit: Payer: Medicare Other

## 2019-11-20 DIAGNOSIS — Z20822 Contact with and (suspected) exposure to covid-19: Secondary | ICD-10-CM

## 2019-11-21 LAB — SARS-COV-2, NAA 2 DAY TAT

## 2019-11-21 LAB — NOVEL CORONAVIRUS, NAA: SARS-CoV-2, NAA: NOT DETECTED

## 2019-12-17 DIAGNOSIS — H25011 Cortical age-related cataract, right eye: Secondary | ICD-10-CM | POA: Diagnosis not present

## 2019-12-17 DIAGNOSIS — E119 Type 2 diabetes mellitus without complications: Secondary | ICD-10-CM | POA: Diagnosis not present

## 2019-12-17 DIAGNOSIS — H26492 Other secondary cataract, left eye: Secondary | ICD-10-CM | POA: Diagnosis not present

## 2019-12-17 DIAGNOSIS — H2511 Age-related nuclear cataract, right eye: Secondary | ICD-10-CM | POA: Diagnosis not present

## 2019-12-22 DIAGNOSIS — M79671 Pain in right foot: Secondary | ICD-10-CM | POA: Diagnosis not present

## 2019-12-22 DIAGNOSIS — G5761 Lesion of plantar nerve, right lower limb: Secondary | ICD-10-CM | POA: Diagnosis not present

## 2019-12-22 DIAGNOSIS — M792 Neuralgia and neuritis, unspecified: Secondary | ICD-10-CM | POA: Diagnosis not present

## 2019-12-22 DIAGNOSIS — E119 Type 2 diabetes mellitus without complications: Secondary | ICD-10-CM | POA: Diagnosis not present

## 2020-01-07 DIAGNOSIS — I10 Essential (primary) hypertension: Secondary | ICD-10-CM | POA: Diagnosis not present

## 2020-01-07 DIAGNOSIS — E7800 Pure hypercholesterolemia, unspecified: Secondary | ICD-10-CM | POA: Diagnosis not present

## 2020-01-07 DIAGNOSIS — E119 Type 2 diabetes mellitus without complications: Secondary | ICD-10-CM | POA: Diagnosis not present

## 2020-01-14 DIAGNOSIS — N1831 Chronic kidney disease, stage 3a: Secondary | ICD-10-CM | POA: Diagnosis not present

## 2020-01-14 DIAGNOSIS — E875 Hyperkalemia: Secondary | ICD-10-CM | POA: Diagnosis not present

## 2020-01-14 DIAGNOSIS — Z0000 Encounter for general adult medical examination without abnormal findings: Secondary | ICD-10-CM | POA: Diagnosis not present

## 2020-01-14 DIAGNOSIS — E1149 Type 2 diabetes mellitus with other diabetic neurological complication: Secondary | ICD-10-CM | POA: Diagnosis not present

## 2020-01-14 DIAGNOSIS — E785 Hyperlipidemia, unspecified: Secondary | ICD-10-CM | POA: Diagnosis not present

## 2020-01-14 DIAGNOSIS — I10 Essential (primary) hypertension: Secondary | ICD-10-CM | POA: Diagnosis not present

## 2020-01-14 DIAGNOSIS — I6529 Occlusion and stenosis of unspecified carotid artery: Secondary | ICD-10-CM | POA: Diagnosis not present

## 2020-01-14 DIAGNOSIS — S60512A Abrasion of left hand, initial encounter: Secondary | ICD-10-CM | POA: Diagnosis not present

## 2020-01-19 DIAGNOSIS — H25811 Combined forms of age-related cataract, right eye: Secondary | ICD-10-CM | POA: Diagnosis not present

## 2020-01-19 DIAGNOSIS — H2511 Age-related nuclear cataract, right eye: Secondary | ICD-10-CM | POA: Diagnosis not present

## 2020-01-19 DIAGNOSIS — H25011 Cortical age-related cataract, right eye: Secondary | ICD-10-CM | POA: Diagnosis not present

## 2020-02-02 ENCOUNTER — Other Ambulatory Visit: Payer: Medicare Other

## 2020-02-02 DIAGNOSIS — Z20822 Contact with and (suspected) exposure to covid-19: Secondary | ICD-10-CM | POA: Diagnosis not present

## 2020-02-04 LAB — NOVEL CORONAVIRUS, NAA: SARS-CoV-2, NAA: NOT DETECTED

## 2020-02-04 LAB — SARS-COV-2, NAA 2 DAY TAT

## 2020-04-01 DIAGNOSIS — Z8546 Personal history of malignant neoplasm of prostate: Secondary | ICD-10-CM | POA: Diagnosis not present

## 2020-04-07 DIAGNOSIS — N5201 Erectile dysfunction due to arterial insufficiency: Secondary | ICD-10-CM | POA: Diagnosis not present

## 2020-04-07 DIAGNOSIS — Z8546 Personal history of malignant neoplasm of prostate: Secondary | ICD-10-CM | POA: Diagnosis not present

## 2020-04-07 DIAGNOSIS — R350 Frequency of micturition: Secondary | ICD-10-CM | POA: Diagnosis not present

## 2020-04-07 DIAGNOSIS — N401 Enlarged prostate with lower urinary tract symptoms: Secondary | ICD-10-CM | POA: Diagnosis not present

## 2020-04-14 DIAGNOSIS — Z6822 Body mass index (BMI) 22.0-22.9, adult: Secondary | ICD-10-CM | POA: Diagnosis not present

## 2020-04-14 DIAGNOSIS — E119 Type 2 diabetes mellitus without complications: Secondary | ICD-10-CM | POA: Diagnosis not present

## 2020-04-14 DIAGNOSIS — E785 Hyperlipidemia, unspecified: Secondary | ICD-10-CM | POA: Diagnosis not present

## 2020-04-14 DIAGNOSIS — N200 Calculus of kidney: Secondary | ICD-10-CM | POA: Diagnosis not present

## 2020-04-28 DIAGNOSIS — Z6822 Body mass index (BMI) 22.0-22.9, adult: Secondary | ICD-10-CM | POA: Diagnosis not present

## 2020-04-28 DIAGNOSIS — E785 Hyperlipidemia, unspecified: Secondary | ICD-10-CM | POA: Diagnosis not present

## 2020-04-28 DIAGNOSIS — N200 Calculus of kidney: Secondary | ICD-10-CM | POA: Diagnosis not present

## 2020-04-28 DIAGNOSIS — E119 Type 2 diabetes mellitus without complications: Secondary | ICD-10-CM | POA: Diagnosis not present

## 2020-06-07 ENCOUNTER — Other Ambulatory Visit (HOSPITAL_BASED_OUTPATIENT_CLINIC_OR_DEPARTMENT_OTHER): Payer: Self-pay

## 2020-06-07 ENCOUNTER — Other Ambulatory Visit: Payer: Self-pay

## 2020-06-07 ENCOUNTER — Ambulatory Visit: Payer: Medicare Other | Attending: Internal Medicine

## 2020-06-07 DIAGNOSIS — Z23 Encounter for immunization: Secondary | ICD-10-CM

## 2020-06-07 MED ORDER — COVID-19 MRNA VACC (MODERNA) 100 MCG/0.5ML IM SUSP
INTRAMUSCULAR | 0 refills | Status: DC
Start: 2020-06-07 — End: 2023-03-07
  Filled 2020-06-07: qty 0.25, 1d supply, fill #0

## 2020-06-07 NOTE — Progress Notes (Signed)
   Covid-19 Vaccination Clinic  Name:  Vincent Kent    MRN: [id number] DOB: 08/22/1945  06/07/2020  Vincent Kent was observed post Covid-19 immunization for 15 minutes without incident. He was provided with Vaccine Information Sheet and instruction to access the V-Safe system.   Vincent Kent was instructed to call 911 with any severe reactions post vaccine: Marland Kitchen Difficulty breathing  . Swelling of face and throat  . A fast heartbeat  . A bad rash all over body  . Dizziness and weakness   Immunizations Administered    Name Date Dose VIS Date Route   Moderna Covid-19 Booster Vaccine 06/07/2020  2:56 PM 0.25 mL 12/03/2019 Intramuscular   Manufacturer: Moderna   Lot: [id number]   [place]: [id number]

## 2020-06-11 ENCOUNTER — Other Ambulatory Visit (HOSPITAL_BASED_OUTPATIENT_CLINIC_OR_DEPARTMENT_OTHER): Payer: Self-pay

## 2020-07-14 DIAGNOSIS — G8929 Other chronic pain: Secondary | ICD-10-CM | POA: Diagnosis not present

## 2020-07-14 DIAGNOSIS — E1149 Type 2 diabetes mellitus with other diabetic neurological complication: Secondary | ICD-10-CM | POA: Diagnosis not present

## 2020-07-14 DIAGNOSIS — M5442 Lumbago with sciatica, left side: Secondary | ICD-10-CM | POA: Diagnosis not present

## 2020-07-14 DIAGNOSIS — Z8781 Personal history of (healed) traumatic fracture: Secondary | ICD-10-CM | POA: Diagnosis not present

## 2020-07-14 DIAGNOSIS — M545 Low back pain, unspecified: Secondary | ICD-10-CM | POA: Diagnosis not present

## 2020-07-26 DIAGNOSIS — M5136 Other intervertebral disc degeneration, lumbar region: Secondary | ICD-10-CM | POA: Diagnosis not present

## 2020-07-26 DIAGNOSIS — Z6822 Body mass index (BMI) 22.0-22.9, adult: Secondary | ICD-10-CM | POA: Diagnosis not present

## 2020-07-26 DIAGNOSIS — E119 Type 2 diabetes mellitus without complications: Secondary | ICD-10-CM | POA: Diagnosis not present

## 2020-07-26 DIAGNOSIS — N200 Calculus of kidney: Secondary | ICD-10-CM | POA: Diagnosis not present

## 2020-07-26 DIAGNOSIS — E785 Hyperlipidemia, unspecified: Secondary | ICD-10-CM | POA: Diagnosis not present

## 2020-07-26 DIAGNOSIS — M5459 Other low back pain: Secondary | ICD-10-CM | POA: Diagnosis not present

## 2020-08-23 DIAGNOSIS — M5459 Other low back pain: Secondary | ICD-10-CM | POA: Diagnosis not present

## 2020-08-23 DIAGNOSIS — M545 Low back pain, unspecified: Secondary | ICD-10-CM | POA: Diagnosis not present

## 2020-08-27 DIAGNOSIS — M545 Low back pain, unspecified: Secondary | ICD-10-CM | POA: Diagnosis not present

## 2020-08-28 DIAGNOSIS — U071 COVID-19: Secondary | ICD-10-CM | POA: Diagnosis not present

## 2020-09-06 ENCOUNTER — Ambulatory Visit: Payer: Medicare Other | Attending: Critical Care Medicine

## 2020-09-06 DIAGNOSIS — Z20822 Contact with and (suspected) exposure to covid-19: Secondary | ICD-10-CM

## 2020-09-07 LAB — SARS-COV-2, NAA 2 DAY TAT

## 2020-09-07 LAB — NOVEL CORONAVIRUS, NAA: SARS-CoV-2, NAA: DETECTED — AB

## 2020-09-07 LAB — SPECIMEN STATUS REPORT

## 2020-09-15 DIAGNOSIS — M545 Low back pain, unspecified: Secondary | ICD-10-CM | POA: Diagnosis not present

## 2020-10-06 DIAGNOSIS — M545 Low back pain, unspecified: Secondary | ICD-10-CM | POA: Diagnosis not present

## 2020-10-09 DIAGNOSIS — U071 COVID-19: Secondary | ICD-10-CM | POA: Diagnosis not present

## 2020-10-20 DIAGNOSIS — M545 Low back pain, unspecified: Secondary | ICD-10-CM | POA: Diagnosis not present

## 2020-10-27 DIAGNOSIS — M545 Low back pain, unspecified: Secondary | ICD-10-CM | POA: Diagnosis not present

## 2020-11-01 DIAGNOSIS — M545 Low back pain, unspecified: Secondary | ICD-10-CM | POA: Diagnosis not present

## 2020-11-29 DIAGNOSIS — M545 Low back pain, unspecified: Secondary | ICD-10-CM | POA: Diagnosis not present

## 2020-12-09 DIAGNOSIS — M545 Low back pain, unspecified: Secondary | ICD-10-CM | POA: Diagnosis not present

## 2021-01-13 ENCOUNTER — Ambulatory Visit: Payer: Medicare Other | Attending: Internal Medicine

## 2021-01-13 ENCOUNTER — Other Ambulatory Visit (HOSPITAL_BASED_OUTPATIENT_CLINIC_OR_DEPARTMENT_OTHER): Payer: Self-pay

## 2021-01-13 DIAGNOSIS — Z23 Encounter for immunization: Secondary | ICD-10-CM

## 2021-01-13 MED ORDER — PFIZER COVID-19 VAC BIVALENT 30 MCG/0.3ML IM SUSP
INTRAMUSCULAR | 0 refills | Status: DC
Start: 2021-01-13 — End: 2023-06-13
  Filled 2021-01-13: qty 0.3, 1d supply, fill #0

## 2021-01-13 NOTE — Progress Notes (Signed)
   Covid-19 Vaccination Clinic  Name:  Vincent Kent    MRN: [id number] DOB: 1945-04-25  01/13/2021  [name] was observed post Covid-19 immunization for 15 minutes without incident. He was provided with Vaccine Information Sheet and instruction to access the V-Safe system.   [name] was instructed to call 911 with any severe reactions post vaccine: Difficulty breathing  Swelling of face and throat  A fast heartbeat  A bad rash all over body  Dizziness and weakness   Immunizations Administered     Name Date Dose VIS Date Route   Pfizer Covid-19 Vaccine Bivalent Booster 01/13/2021  1:06 PM 0.3 mL 10/13/2020 Intramuscular   Manufacturer: [place]   Lot: [id number]   [place]: [phone]

## 2021-01-17 DIAGNOSIS — Z125 Encounter for screening for malignant neoplasm of prostate: Secondary | ICD-10-CM | POA: Diagnosis not present

## 2021-01-17 DIAGNOSIS — E119 Type 2 diabetes mellitus without complications: Secondary | ICD-10-CM | POA: Diagnosis not present

## 2021-01-17 DIAGNOSIS — E1149 Type 2 diabetes mellitus with other diabetic neurological complication: Secondary | ICD-10-CM | POA: Diagnosis not present

## 2021-01-17 DIAGNOSIS — E7801 Familial hypercholesterolemia: Secondary | ICD-10-CM | POA: Diagnosis not present

## 2021-01-17 DIAGNOSIS — I10 Essential (primary) hypertension: Secondary | ICD-10-CM | POA: Diagnosis not present

## 2021-01-20 DIAGNOSIS — I10 Essential (primary) hypertension: Secondary | ICD-10-CM | POA: Diagnosis not present

## 2021-01-20 DIAGNOSIS — I6529 Occlusion and stenosis of unspecified carotid artery: Secondary | ICD-10-CM | POA: Diagnosis not present

## 2021-01-20 DIAGNOSIS — L299 Pruritus, unspecified: Secondary | ICD-10-CM | POA: Diagnosis not present

## 2021-01-20 DIAGNOSIS — Z0000 Encounter for general adult medical examination without abnormal findings: Secondary | ICD-10-CM | POA: Diagnosis not present

## 2021-01-20 DIAGNOSIS — N1831 Chronic kidney disease, stage 3a: Secondary | ICD-10-CM | POA: Diagnosis not present

## 2021-01-20 DIAGNOSIS — E785 Hyperlipidemia, unspecified: Secondary | ICD-10-CM | POA: Diagnosis not present

## 2021-01-20 DIAGNOSIS — E7800 Pure hypercholesterolemia, unspecified: Secondary | ICD-10-CM | POA: Diagnosis not present

## 2021-01-20 DIAGNOSIS — E1149 Type 2 diabetes mellitus with other diabetic neurological complication: Secondary | ICD-10-CM | POA: Diagnosis not present

## 2021-01-21 ENCOUNTER — Other Ambulatory Visit: Payer: Self-pay | Admitting: Internal Medicine

## 2021-01-21 DIAGNOSIS — E7800 Pure hypercholesterolemia, unspecified: Secondary | ICD-10-CM

## 2021-01-28 DIAGNOSIS — U071 COVID-19: Secondary | ICD-10-CM | POA: Diagnosis not present

## 2021-01-31 DIAGNOSIS — E119 Type 2 diabetes mellitus without complications: Secondary | ICD-10-CM | POA: Diagnosis not present

## 2021-01-31 DIAGNOSIS — Z6822 Body mass index (BMI) 22.0-22.9, adult: Secondary | ICD-10-CM | POA: Diagnosis not present

## 2021-01-31 DIAGNOSIS — E785 Hyperlipidemia, unspecified: Secondary | ICD-10-CM | POA: Diagnosis not present

## 2021-01-31 DIAGNOSIS — N200 Calculus of kidney: Secondary | ICD-10-CM | POA: Diagnosis not present

## 2021-02-23 ENCOUNTER — Ambulatory Visit
Admission: RE | Admit: 2021-02-23 | Discharge: 2021-02-23 | Disposition: A | Discharge status: Home / Self Care | Payer: No Typology Code available for payment source | Source: Ambulatory Visit | Attending: Internal Medicine | Admitting: Internal Medicine

## 2021-02-23 DIAGNOSIS — E7800 Pure hypercholesterolemia, unspecified: Secondary | ICD-10-CM

## 2021-02-24 DIAGNOSIS — I2510 Atherosclerotic heart disease of native coronary artery without angina pectoris: Secondary | ICD-10-CM | POA: Diagnosis not present

## 2021-02-24 DIAGNOSIS — R931 Abnormal findings on diagnostic imaging of heart and coronary circulation: Secondary | ICD-10-CM | POA: Diagnosis not present

## 2021-02-25 DIAGNOSIS — M792 Neuralgia and neuritis, unspecified: Secondary | ICD-10-CM | POA: Diagnosis not present

## 2021-02-25 DIAGNOSIS — E139 Other specified diabetes mellitus without complications: Secondary | ICD-10-CM | POA: Diagnosis not present

## 2021-03-16 ENCOUNTER — Encounter: Payer: Self-pay | Admitting: Cardiovascular Disease

## 2021-03-16 ENCOUNTER — Ambulatory Visit (INDEPENDENT_AMBULATORY_CARE_PROVIDER_SITE_OTHER): Payer: Medicare Other | Admitting: Cardiovascular Disease

## 2021-03-16 ENCOUNTER — Other Ambulatory Visit: Payer: Self-pay

## 2021-03-16 VITALS — BP 118/74 | HR 81 | Ht 70.0 in | Wt 153.2 lb

## 2021-03-16 DIAGNOSIS — R931 Abnormal findings on diagnostic imaging of heart and coronary circulation: Secondary | ICD-10-CM | POA: Diagnosis not present

## 2021-03-16 NOTE — Patient Instructions (Signed)
Medication Instructions:  No changes *If you need a refill on your cardiac medications before your next appointment, please call your pharmacy*   Lab Work: None ordered If you have labs (blood work) drawn today and your tests are completely normal, you will receive your results only by: [place] (if you have MyChart) OR A paper copy in the mail If you have any lab test that is abnormal or we need to change your treatment, we will call you to review the results.   Testing/Procedures: Your physician has requested that you have an exercise tolerance test. For further information please visit [email or web address]. Please also follow instruction sheet, as given. This will take place at [place], [address]. Do not drink or eat foods with caffeine for 24 hours before the test. (Chocolate, coffee, tea, or energy drinks) If you use an inhaler, bring it with you to the test. Do not smoke for 4 hours before the test. Wear comfortable shoes and clothing.   Follow-Up: At [facility], you and your health needs are our priority.  As part of our continuing mission to provide you with exceptional heart care, we have created designated Provider Care Teams.  These Care Teams include your primary Cardiologist (physician) and Advanced Practice Providers (APPs -  Physician Assistants and Nurse Practitioners) who all work together to provide you with the care you need, when you need it.  We recommend signing up for the patient portal called "MyChart".  Sign up information is provided on this After Visit Summary.  MyChart is used to connect with patients for Virtual Visits (Telemedicine).  Patients are able to view lab/test results, encounter notes, upcoming appointments, etc.  Non-urgent messages can be sent to your provider as well.   To learn more about what you can do with MyChart, go to [email or web address].    Your next appointment:   Follow up as needed with Dr. Sallyanne Kuster

## 2021-03-16 NOTE — Progress Notes (Signed)
Cardiology Office Note:    Date:  03/16/2021   ID:  Vincent Kent, DOB 04-13-1945, MRN [id number]  PCP:  Deland Pretty, MD   [facility] Providers Cardiologist:  None     Referring MD: Deland Pretty, MD   Chief Complaint  Patient presents with   Advice Only  Vincent Kent is a 76 y.o. male who is being seen today for the evaluation of elevated coronary calcium score at the request of Deland Pretty, MD.   History of Present Illness:    Vincent Kent is a 76 y.o. male with a hx of nonobstructive carotid atherosclerosis, type 2 diabetes mellitus without complication, treated hypertension and hypercholesterolemia, degenerative cervical spine disc disease, remote history of prostate cancer and sciatica, referred for an abnormal coronary calcium score.  He does not have any history of symptomatic atherosclerotic disease.  Carotid atherosclerosis (bilateral plaque with less than 50% stenosis) was discovered incidentally by ultrasound following a CT performed for cervical spine disease.  He has never had a stroke or TIA.  He walks on a regular basis and in the summer as an avid gardener and has never had complaints of exertional angina or dyspnea.  He denies palpitations, dizziness, syncope, leg edema, claudication.  Has erectile dysfunction, responsive to PDE 5 inhibitors.  Coronary calcium score was markedly elevated at 2121 (89th percentile).  More than half of the measure of calcium plaque was in the right coronary artery.  His family history is significant for early onset CAD in his father at age 67 who was a heavy smoker.  No other family members have coronary disease.  He has a history of well treated type II diabetes mellitus and his most recent hemoglobin A1c was a little higher than usual at 7.2%.  He is taking both a GLP-1 agonist and a SGLT2 inhibitor as well as metformin and insulin Tyler Aas).  He is quite lean with a BMI of only 22.  He has been on  atorvastatin 10 mg daily for many years.  LDL cholesterol is 88 on labs performed 01/17/2021.  After his coronary calcium score results he was switched to rosuvastatin 20 mg daily.  Past Medical History:  Diagnosis Date   Carotid stenosis    Cervical disc disease    Cervical radiculopathy    Diabetes mellitus    type 2   Diabetes mellitus without complication ([place])    Erectile dysfunction    Hepatic steatosis    History of kidney stones    Hyperlipidemia    Hypertension    Pelvic fracture ([place]) 2005   acetabulum fx also   Polyp of colon, adenomatous 10/27/2011   August 2013 - for f/u colonoscopy at 5 yrs   Prostate cancer [facility])    Sciatica 07/17/2011    Past Surgical History:  Procedure Laterality Date   bone spur  2012   cervical bone spur removal   HERNIA REPAIR     INGUINAL HERNIA REPAIR Bilateral 1990   left x 2,right 1   PROSTATE BIOPSY     RADIOACTIVE SEED IMPLANT N/A 03/19/2018   Procedure: RADIOACTIVE SEED IMPLANT/BRACHYTHERAPY IMPLANT;  Surgeon: Festus Aloe, MD;  Location: [place];  Service: Urology;  Laterality: N/A;   SPACE OAR INSTILLATION N/A 03/19/2018   Procedure: SPACE OAR INSTILLATION;  Surgeon: Festus Aloe, MD;  Location[facility];  Service: Urology;  Laterality: N/A;   TONSILLECTOMY  1949    Current Medications: Current Meds  Medication Sig  aspirin 81 MG tablet Take 81 mg by mouth daily.   BD PEN NEEDLE NANO 2ND GEN 32G X 4 MM MISC USE 1 NEEDLE FOR INJECTION ONCE DAILY   Dapagliflozin-metFORMIN HCl ER (XIGDUO XR) 2.06-998 MG TB24 Take 2.5-1,000 mg by mouth 2 (two) times daily.   Dapagliflozin-metFORMIN HCl ER (XIGDUO XR) 06-998 MG TB24 Take by mouth 2 (two) times daily.   glucose blood (ONE TOUCH ULTRA TEST) test strip Use as directed twice daily as directed.  Diagnosis code 250.02   insulin degludec (TRESIBA FLEXTOUCH) 100 UNIT/ML FlexTouch Pen Tresiba FlexTouch U-100 insulin 100 unit/mL (3 mL) subcutaneous  pen   3 units every day by sub-q route.   losartan (COZAAR) 25 MG tablet Take 25 mg by mouth at bedtime.    rosuvastatin (CRESTOR) 20 MG tablet Take 20 mg by mouth daily.     Allergies:   Flomax [tamsulosin hcl] and Viagra [sildenafil citrate]   Social History   Socioeconomic History   Marital status: Married    Spouse name: Not on file   Number of children: 2   Years of education: 16   Highest education level: Not on file  Occupational History   Occupation: Adult nurse, Brewing technologist   Occupation: retired  Tobacco Use   Smoking status: Never   Smokeless tobacco: Never  Scientific laboratory technician Use: Never used  Substance and Sexual Activity   Alcohol use: Yes    Comment: bottle of wine q 2 weeks   Drug use: No   Sexual activity: Not Currently  Other Topics Concern   Not on file  Social History Narrative   ** Merged History Encounter **       Social Determinants of Health   Financial Resource Strain: Not on file  Food Insecurity: Not on file  Transportation Needs: Not on file  Physical Activity: Not on file  Stress: Not on file  Social Connections: Not on file     Family History: The patient's family history includes Cancer in his mother; Dementia in his mother; Heart disease in his father and mother; Stroke in his mother. There is no history of Colon cancer.  ROS:   Please see the history of present illness.     All other systems reviewed and are negative.  EKGs/Labs/Other Studies Reviewed:    The following studies were reviewed today: Coronary calcium score reviewed  EKG:  EKG is ordered today.  The ekg ordered today demonstrates sinus rhythm with a single PAC, otherwise normal tracing  Recent Labs: No results found for requested labs within last 8760 hours.  Recent Lipid Panel    Component Value Date/Time   CHOL 143 09/26/2012 0946   TRIG 90.0 09/26/2012 0946   HDL 43.20 09/26/2012 0946   CHOLHDL 3 09/26/2012 0946   VLDL 18.0 09/26/2012 0946   LDLCALC 82  09/26/2012 0946   LDLDIRECT 78.6 07/17/2011 1018   01/17/2021 creatinine 1.22, potassium 4.7, normal liver function tests Cholesterol 157, triglycerides 175, HDL 42, LDL 80  Risk Assessment/Calculations:           Physical Exam:    VS:  BP 118/74 (BP Location: Left Arm, Patient Position: Sitting, Cuff Size: Normal)    Pulse 81    Ht 5\' 10"  (1.778 m)    Wt 153 lb 3.2 oz (69.5 kg)    SpO2 98%    BMI 21.98 kg/m     Wt Readings from Last 3 Encounters:  03/16/21 153 lb 3.2 oz (69.5 kg)  07/18/19 161 lb (73 kg)  04/03/18 169 lb (76.7 kg)     GEN: Lean and fit, appears younger than stated age; well nourished, well developed in no acute distress HEENT: Normal NECK: No JVD; No carotid bruits LYMPHATICS: No lymphadenopathy CARDIAC: RRR, no murmurs, rubs, gallops RESPIRATORY:  Clear to auscultation without rales, wheezing or rhonchi  ABDOMEN: Soft, non-tender, non-distended MUSCULOSKELETAL:  No edema; No deformity  SKIN: Warm and dry NEUROLOGIC:  Alert and oriented x 3 PSYCHIATRIC:  Normal affect   ASSESSMENT:    1. Elevated coronary artery calcium score    PLAN:    In order of problems listed above:  Elevated coronary calcium score: Asymptomatic.  We will schedule for a treadmill stress test.  Risk factors have been very well managed by his PCP.  Ideally would get the hemoglobin A1c less than 7% and LDL cholesterol less than 70.  Appropriate changes were made to his statin prescription.  If the treadmill stress test is normal I would not advise further cardiac evaluation at this time, but if there is evidence of ischemia he will benefit from additional imaging studies. Carotid atherosclerosis: Documented on ultrasound from 2010, but without any evidence of significant stenosis.  The treatment is the same, focus on risk factor mitigation.  Asymptomatic. DM: Target LDL less than 7%, very close to that target.  I am pleased to see that he is taking GLP-1 agonist and SGLT2 inhibitors  which will provide the best outcome from cardiovascular disease. HLP: Recently switched to a more potent statin.  Suspect that on rosuvastatin 20 mg daily his LDL will indeed reach a target of less than 70.      Shared Decision Making/Informed Consent The risks [chest pain, shortness of breath, cardiac arrhythmias, dizziness, blood pressure fluctuations, myocardial infarction, stroke/transient ischemic attack, and life-threatening complications (estimated to be 1 in 10,000)], benefits (risk stratification, diagnosing coronary artery disease, treatment guidance) and alternatives of an exercise tolerance test were discussed in detail with Mr. Saric and he agrees to proceed.    Medication Adjustments/Labs and Tests Ordered: Current medicines are reviewed at length with the patient today.  Concerns regarding medicines are outlined above.  Orders Placed This Encounter  Procedures   Exercise Tolerance Test   No orders of the defined types were placed in this encounter.   Patient Instructions  Medication Instructions:  No changes *If you need a refill on your cardiac medications before your next appointment, please call your pharmacy*   Lab Work: None ordered If you have labs (blood work) drawn today and your tests are completely normal, you will receive your results only by: [place] (if you have MyChart) OR A paper copy in the mail If you have any lab test that is abnormal or we need to change your treatment, we will call you to review the results.   Testing/Procedures: Your physician has requested that you have an exercise tolerance test. For further information please visit [email or web address]. Please also follow instruction sheet, as given. This will take place at [place], [address]. Do not drink or eat foods with caffeine for 24 hours before the test. (Chocolate, coffee, tea, or energy drinks) If you use an inhaler, bring it with you to the test. Do not smoke  for 4 hours before the test. Wear comfortable shoes and clothing.   Follow-Up: At [facility], you and your health needs are our priority.  As part of our continuing mission to provide you with exceptional heart  care, we have created designated Provider Care Teams.  These Care Teams include your primary Cardiologist (physician) and Advanced Practice Providers (APPs -  Physician Assistants and Nurse Practitioners) who all work together to provide you with the care you need, when you need it.  We recommend signing up for the patient portal called "MyChart".  Sign up information is provided on this After Visit Summary.  MyChart is used to connect with patients for Virtual Visits (Telemedicine).  Patients are able to view lab/test results, encounter notes, upcoming appointments, etc.  Non-urgent messages can be sent to your provider as well.   To learn more about what you can do with MyChart, go to [email or web address].    Your next appointment:   Follow up as needed with Dr. Sallyanne Kuster    Signed, Sanda Klein, MD  03/16/2021 10:26 AM    [place]

## 2021-03-22 NOTE — Addendum Note (Signed)
Addended by: Orma Render on: 03/22/2021 04:34 PM   Modules accepted: Orders

## 2021-03-25 ENCOUNTER — Telehealth (HOSPITAL_COMMUNITY): Payer: Self-pay | Admitting: *Deleted

## 2021-03-25 NOTE — Telephone Encounter (Signed)
Close encounter 

## 2021-03-29 ENCOUNTER — Other Ambulatory Visit: Payer: Self-pay

## 2021-03-29 NOTE — Addendum Note (Signed)
Addended by: Sanda Klein on: 03/29/2021 08:54 AM   Modules accepted: Orders

## 2021-03-29 NOTE — Addendum Note (Signed)
Addended by: Orma Render on: 03/29/2021 01:03 PM   Modules accepted: Orders

## 2021-03-30 ENCOUNTER — Other Ambulatory Visit: Payer: Self-pay

## 2021-03-30 ENCOUNTER — Ambulatory Visit (HOSPITAL_COMMUNITY)
Admission: RE | Admit: 2021-03-30 | Discharge: 2021-03-30 | Disposition: A | Discharge status: Home / Self Care | Payer: Medicare Other | Source: Ambulatory Visit | Attending: Cardiology | Admitting: Cardiology

## 2021-03-30 DIAGNOSIS — R931 Abnormal findings on diagnostic imaging of heart and coronary circulation: Secondary | ICD-10-CM | POA: Insufficient documentation

## 2021-03-30 LAB — EXERCISE TOLERANCE TEST
Angina Index: 0
Duke Treadmill Score: 6
Estimated workload: 7.2
Exercise duration (min): 6 min
Exercise duration (sec): 9 s
MPHR: 143 {beats}/min
Peak HR: 153 {beats}/min
Percent HR: 106 %
Rest HR: 79 {beats}/min
ST Depression (mm): 0 mm

## 2021-03-31 ENCOUNTER — Telehealth: Payer: Self-pay | Admitting: Cardiovascular Disease

## 2021-03-31 NOTE — Telephone Encounter (Signed)
° ° °  Pt is calling to get stress test result °

## 2021-03-31 NOTE — Telephone Encounter (Signed)
pt aware of results  

## 2021-04-01 DIAGNOSIS — U071 COVID-19: Secondary | ICD-10-CM | POA: Diagnosis not present

## 2021-04-04 DIAGNOSIS — I2510 Atherosclerotic heart disease of native coronary artery without angina pectoris: Secondary | ICD-10-CM | POA: Diagnosis not present

## 2021-04-26 DIAGNOSIS — Z961 Presence of intraocular lens: Secondary | ICD-10-CM | POA: Diagnosis not present

## 2021-04-26 DIAGNOSIS — H26493 Other secondary cataract, bilateral: Secondary | ICD-10-CM | POA: Diagnosis not present

## 2021-05-02 DIAGNOSIS — Z8546 Personal history of malignant neoplasm of prostate: Secondary | ICD-10-CM | POA: Diagnosis not present

## 2021-05-05 ENCOUNTER — Other Ambulatory Visit: Payer: Self-pay

## 2021-05-09 DIAGNOSIS — R350 Frequency of micturition: Secondary | ICD-10-CM | POA: Diagnosis not present

## 2021-05-09 DIAGNOSIS — N401 Enlarged prostate with lower urinary tract symptoms: Secondary | ICD-10-CM | POA: Diagnosis not present

## 2021-05-09 DIAGNOSIS — Z8546 Personal history of malignant neoplasm of prostate: Secondary | ICD-10-CM | POA: Diagnosis not present

## 2021-05-09 DIAGNOSIS — N5201 Erectile dysfunction due to arterial insufficiency: Secondary | ICD-10-CM | POA: Diagnosis not present

## 2021-05-11 DIAGNOSIS — U071 COVID-19: Secondary | ICD-10-CM | POA: Diagnosis not present

## 2021-06-22 DIAGNOSIS — Z20822 Contact with and (suspected) exposure to covid-19: Secondary | ICD-10-CM | POA: Diagnosis not present

## 2021-07-13 ENCOUNTER — Other Ambulatory Visit: Payer: Self-pay

## 2021-07-20 DIAGNOSIS — R1013 Epigastric pain: Secondary | ICD-10-CM | POA: Diagnosis not present

## 2021-07-20 DIAGNOSIS — R1032 Left lower quadrant pain: Secondary | ICD-10-CM | POA: Diagnosis not present

## 2021-08-03 DIAGNOSIS — E1165 Type 2 diabetes mellitus with hyperglycemia: Secondary | ICD-10-CM | POA: Diagnosis not present

## 2021-08-03 DIAGNOSIS — I2510 Atherosclerotic heart disease of native coronary artery without angina pectoris: Secondary | ICD-10-CM | POA: Diagnosis not present

## 2021-08-03 DIAGNOSIS — E1149 Type 2 diabetes mellitus with other diabetic neurological complication: Secondary | ICD-10-CM | POA: Diagnosis not present

## 2021-08-03 DIAGNOSIS — E7800 Pure hypercholesterolemia, unspecified: Secondary | ICD-10-CM | POA: Diagnosis not present

## 2021-08-03 DIAGNOSIS — I10 Essential (primary) hypertension: Secondary | ICD-10-CM | POA: Diagnosis not present

## 2021-08-03 DIAGNOSIS — E119 Type 2 diabetes mellitus without complications: Secondary | ICD-10-CM | POA: Diagnosis not present

## 2021-08-03 DIAGNOSIS — N1831 Chronic kidney disease, stage 3a: Secondary | ICD-10-CM | POA: Diagnosis not present

## 2021-08-03 DIAGNOSIS — I6529 Occlusion and stenosis of unspecified carotid artery: Secondary | ICD-10-CM | POA: Diagnosis not present

## 2021-08-11 DIAGNOSIS — K7689 Other specified diseases of liver: Secondary | ICD-10-CM | POA: Diagnosis not present

## 2021-08-11 DIAGNOSIS — N2889 Other specified disorders of kidney and ureter: Secondary | ICD-10-CM | POA: Diagnosis not present

## 2021-08-11 DIAGNOSIS — R109 Unspecified abdominal pain: Secondary | ICD-10-CM | POA: Diagnosis not present

## 2021-12-22 DIAGNOSIS — Z23 Encounter for immunization: Secondary | ICD-10-CM | POA: Diagnosis not present

## 2022-01-19 DIAGNOSIS — E1165 Type 2 diabetes mellitus with hyperglycemia: Secondary | ICD-10-CM | POA: Diagnosis not present

## 2022-01-19 DIAGNOSIS — C61 Malignant neoplasm of prostate: Secondary | ICD-10-CM | POA: Diagnosis not present

## 2022-01-19 DIAGNOSIS — I10 Essential (primary) hypertension: Secondary | ICD-10-CM | POA: Diagnosis not present

## 2022-01-19 DIAGNOSIS — E119 Type 2 diabetes mellitus without complications: Secondary | ICD-10-CM | POA: Diagnosis not present

## 2022-01-19 DIAGNOSIS — Z794 Long term (current) use of insulin: Secondary | ICD-10-CM | POA: Diagnosis not present

## 2022-01-19 DIAGNOSIS — I2510 Atherosclerotic heart disease of native coronary artery without angina pectoris: Secondary | ICD-10-CM | POA: Diagnosis not present

## 2022-01-19 DIAGNOSIS — E785 Hyperlipidemia, unspecified: Secondary | ICD-10-CM | POA: Diagnosis not present

## 2022-01-25 ENCOUNTER — Telehealth: Payer: Self-pay

## 2022-01-25 DIAGNOSIS — D649 Anemia, unspecified: Secondary | ICD-10-CM | POA: Diagnosis not present

## 2022-01-25 DIAGNOSIS — I2510 Atherosclerotic heart disease of native coronary artery without angina pectoris: Secondary | ICD-10-CM | POA: Diagnosis not present

## 2022-01-25 DIAGNOSIS — F411 Generalized anxiety disorder: Secondary | ICD-10-CM | POA: Diagnosis not present

## 2022-01-25 DIAGNOSIS — Z23 Encounter for immunization: Secondary | ICD-10-CM | POA: Diagnosis not present

## 2022-01-25 DIAGNOSIS — I10 Essential (primary) hypertension: Secondary | ICD-10-CM | POA: Diagnosis not present

## 2022-01-25 DIAGNOSIS — E1149 Type 2 diabetes mellitus with other diabetic neurological complication: Secondary | ICD-10-CM | POA: Diagnosis not present

## 2022-01-25 DIAGNOSIS — Z0000 Encounter for general adult medical examination without abnormal findings: Secondary | ICD-10-CM | POA: Diagnosis not present

## 2022-01-25 NOTE — Patient Instructions (Signed)
Visit Information  Thank you for taking time to visit with me today. Please don't hesitate to contact me if I can be of assistance to you.   Following are the goals we discussed today:   Goals Addressed             This Visit's Progress    Care concerns for wife       Care Coordination Interventions: Patient concerned about spouse who is at a rehab facility.  He declined needs for himself.  Discussed care and [facility] services and support.  Will collaborate with [facility] team and POD nurse for spouse.             If you are experiencing a Mental Health or [place] or need someone to talk to, please call the Suicide and Crisis Lifeline: 988   Patient verbalizes understanding of instructions and care plan provided today and agrees to view in [place]. Active MyChart status and patient understanding of how to access instructions and care plan via MyChart confirmed with patient.     No further follow up required:    Jone Baseman, RN, MSN [place] Management Care Management Coordinator Direct Line [phone]

## 2022-01-25 NOTE — Patient Outreach (Signed)
  Care Coordination   Initial Visit Note   01/25/2022 Name: Vincent Kent MRN: [id number] DOB: 1945-03-31  Vincent Kent is a 76 y.o. year old male who sees Deland Pretty, MD for primary care. I engaged with Vincent Kent in the providers office today.  What matters to the patients health and wellness today?  Wife antivipated d/c from rehab    Goals Addressed             This Visit's Progress    Care concerns for wife       Care Coordination Interventions: Patient concerned about spouse who is at a rehab facility.  He declined needs for himself.  Discussed care and [facility] services and support.  Will collaborate with [facility] team and POD nurse for spouse.            SDOH assessments and interventions completed:  Yes     Care Coordination Interventions:  Yes, provided   Follow up plan:  CM will follow up with [facility] team    Encounter Outcome:  Pt. Visit Completed   Jone Baseman, RN, MSN [place] Management Care Management Coordinator Direct Line [phone]

## 2022-01-26 ENCOUNTER — Telehealth: Payer: Self-pay

## 2022-01-26 NOTE — Patient Outreach (Signed)
  Care Coordination   Follow Up Visit Note   01/26/2022 Name: ALRICK CUBBAGE MRN: [id number] DOB: [birth year]  BRAYEN BUNN is a 76 y.o. year old male who sees Deland Pretty, MD for primary care. I spoke with  Johney Frame by phone today. Mr. Baxendale called in to get resources for 18 inch wheelchairs for his wife prior to her rehab discharge. Advised on [employer] and [facility] supply.  He verbalized understanding and is appreciative of the assistance.    What matters to the patients health and wellness today?  N/A    Goals Addressed   None     SDOH assessments and interventions completed:  No     Care Coordination Interventions:  Yes, provided   Follow up plan: No further intervention required.   Encounter Outcome:  Pt. Visit Completed   Jone Baseman, RN, MSN [place] Management Care Management Coordinator Direct Line [phone]

## 2022-01-26 NOTE — Patient Outreach (Signed)
  Care Coordination   01/26/2022 Name: Vincent Kent MRN: [id number] DOB: Oct 16, 1945   Care Coordination Outreach Attempts:  An unsuccessful telephone outreach was attempted today to offer the patient information about available care coordination services as a benefit of their health plan.   Follow Up Plan:  Additional outreach attempts will be made to offer the patient care coordination information and services.   Encounter Outcome:  No Answer   Care Coordination Interventions:  No, not indicated    Jone Baseman, RN, MSN [place] Management Care Management Coordinator Direct Line [phone]

## 2022-01-31 DIAGNOSIS — D649 Anemia, unspecified: Secondary | ICD-10-CM | POA: Diagnosis not present

## 2022-02-27 DIAGNOSIS — E139 Other specified diabetes mellitus without complications: Secondary | ICD-10-CM | POA: Diagnosis not present

## 2022-02-27 DIAGNOSIS — M21622 Bunionette of left foot: Secondary | ICD-10-CM | POA: Diagnosis not present

## 2022-02-27 DIAGNOSIS — I70203 Unspecified atherosclerosis of native arteries of extremities, bilateral legs: Secondary | ICD-10-CM | POA: Diagnosis not present

## 2022-02-27 DIAGNOSIS — D2372 Other benign neoplasm of skin of left lower limb, including hip: Secondary | ICD-10-CM | POA: Diagnosis not present

## 2022-02-27 DIAGNOSIS — M792 Neuralgia and neuritis, unspecified: Secondary | ICD-10-CM | POA: Diagnosis not present

## 2022-03-24 DIAGNOSIS — M533 Sacrococcygeal disorders, not elsewhere classified: Secondary | ICD-10-CM | POA: Diagnosis not present

## 2022-03-24 DIAGNOSIS — M47816 Spondylosis without myelopathy or radiculopathy, lumbar region: Secondary | ICD-10-CM | POA: Diagnosis not present

## 2022-04-03 DIAGNOSIS — M545 Low back pain, unspecified: Secondary | ICD-10-CM | POA: Diagnosis not present

## 2022-04-13 DIAGNOSIS — M545 Low back pain, unspecified: Secondary | ICD-10-CM | POA: Diagnosis not present

## 2022-04-17 DIAGNOSIS — Z8546 Personal history of malignant neoplasm of prostate: Secondary | ICD-10-CM | POA: Diagnosis not present

## 2022-04-18 DIAGNOSIS — M545 Low back pain, unspecified: Secondary | ICD-10-CM | POA: Diagnosis not present

## 2022-04-21 DIAGNOSIS — E1149 Type 2 diabetes mellitus with other diabetic neurological complication: Secondary | ICD-10-CM | POA: Diagnosis not present

## 2022-04-21 DIAGNOSIS — I10 Essential (primary) hypertension: Secondary | ICD-10-CM | POA: Diagnosis not present

## 2022-04-21 DIAGNOSIS — E1165 Type 2 diabetes mellitus with hyperglycemia: Secondary | ICD-10-CM | POA: Diagnosis not present

## 2022-04-21 DIAGNOSIS — E785 Hyperlipidemia, unspecified: Secondary | ICD-10-CM | POA: Diagnosis not present

## 2022-04-24 DIAGNOSIS — N401 Enlarged prostate with lower urinary tract symptoms: Secondary | ICD-10-CM | POA: Diagnosis not present

## 2022-04-24 DIAGNOSIS — R350 Frequency of micturition: Secondary | ICD-10-CM | POA: Diagnosis not present

## 2022-04-24 DIAGNOSIS — Z8546 Personal history of malignant neoplasm of prostate: Secondary | ICD-10-CM | POA: Diagnosis not present

## 2022-05-01 DIAGNOSIS — Z961 Presence of intraocular lens: Secondary | ICD-10-CM | POA: Diagnosis not present

## 2022-05-01 DIAGNOSIS — H26493 Other secondary cataract, bilateral: Secondary | ICD-10-CM | POA: Diagnosis not present

## 2022-05-04 DIAGNOSIS — M545 Low back pain, unspecified: Secondary | ICD-10-CM | POA: Diagnosis not present

## 2022-05-12 DIAGNOSIS — M533 Sacrococcygeal disorders, not elsewhere classified: Secondary | ICD-10-CM | POA: Diagnosis not present

## 2022-05-12 DIAGNOSIS — M47816 Spondylosis without myelopathy or radiculopathy, lumbar region: Secondary | ICD-10-CM | POA: Diagnosis not present

## 2022-05-26 DIAGNOSIS — M545 Low back pain, unspecified: Secondary | ICD-10-CM | POA: Diagnosis not present

## 2022-05-26 DIAGNOSIS — M5416 Radiculopathy, lumbar region: Secondary | ICD-10-CM | POA: Diagnosis not present

## 2022-06-02 DIAGNOSIS — M533 Sacrococcygeal disorders, not elsewhere classified: Secondary | ICD-10-CM | POA: Diagnosis not present

## 2022-06-02 DIAGNOSIS — M47816 Spondylosis without myelopathy or radiculopathy, lumbar region: Secondary | ICD-10-CM | POA: Diagnosis not present

## 2022-06-14 DIAGNOSIS — L03031 Cellulitis of right toe: Secondary | ICD-10-CM | POA: Diagnosis not present

## 2022-06-14 DIAGNOSIS — M792 Neuralgia and neuritis, unspecified: Secondary | ICD-10-CM | POA: Diagnosis not present

## 2022-06-14 DIAGNOSIS — M21622 Bunionette of left foot: Secondary | ICD-10-CM | POA: Diagnosis not present

## 2022-06-14 DIAGNOSIS — D2372 Other benign neoplasm of skin of left lower limb, including hip: Secondary | ICD-10-CM | POA: Diagnosis not present

## 2022-06-14 DIAGNOSIS — I70203 Unspecified atherosclerosis of native arteries of extremities, bilateral legs: Secondary | ICD-10-CM | POA: Diagnosis not present

## 2022-06-14 DIAGNOSIS — E139 Other specified diabetes mellitus without complications: Secondary | ICD-10-CM | POA: Diagnosis not present

## 2022-06-23 DIAGNOSIS — I70203 Unspecified atherosclerosis of native arteries of extremities, bilateral legs: Secondary | ICD-10-CM | POA: Diagnosis not present

## 2022-06-23 DIAGNOSIS — M21622 Bunionette of left foot: Secondary | ICD-10-CM | POA: Diagnosis not present

## 2022-06-23 DIAGNOSIS — E139 Other specified diabetes mellitus without complications: Secondary | ICD-10-CM | POA: Diagnosis not present

## 2022-06-23 DIAGNOSIS — M792 Neuralgia and neuritis, unspecified: Secondary | ICD-10-CM | POA: Diagnosis not present

## 2022-06-23 DIAGNOSIS — L03031 Cellulitis of right toe: Secondary | ICD-10-CM | POA: Diagnosis not present

## 2022-06-23 DIAGNOSIS — D2372 Other benign neoplasm of skin of left lower limb, including hip: Secondary | ICD-10-CM | POA: Diagnosis not present

## 2022-07-07 DIAGNOSIS — L03031 Cellulitis of right toe: Secondary | ICD-10-CM | POA: Diagnosis not present

## 2022-07-07 DIAGNOSIS — L03032 Cellulitis of left toe: Secondary | ICD-10-CM | POA: Diagnosis not present

## 2022-07-18 DIAGNOSIS — L03031 Cellulitis of right toe: Secondary | ICD-10-CM | POA: Diagnosis not present

## 2022-07-24 DIAGNOSIS — I10 Essential (primary) hypertension: Secondary | ICD-10-CM | POA: Diagnosis not present

## 2022-07-24 DIAGNOSIS — E1149 Type 2 diabetes mellitus with other diabetic neurological complication: Secondary | ICD-10-CM | POA: Diagnosis not present

## 2022-07-24 DIAGNOSIS — E119 Type 2 diabetes mellitus without complications: Secondary | ICD-10-CM | POA: Diagnosis not present

## 2022-07-24 DIAGNOSIS — E7800 Pure hypercholesterolemia, unspecified: Secondary | ICD-10-CM | POA: Diagnosis not present

## 2022-07-24 DIAGNOSIS — E1165 Type 2 diabetes mellitus with hyperglycemia: Secondary | ICD-10-CM | POA: Diagnosis not present

## 2022-07-28 DIAGNOSIS — L089 Local infection of the skin and subcutaneous tissue, unspecified: Secondary | ICD-10-CM | POA: Diagnosis not present

## 2022-07-28 DIAGNOSIS — L600 Ingrowing nail: Secondary | ICD-10-CM | POA: Diagnosis not present

## 2022-07-28 DIAGNOSIS — M792 Neuralgia and neuritis, unspecified: Secondary | ICD-10-CM | POA: Diagnosis not present

## 2022-08-08 DIAGNOSIS — E785 Hyperlipidemia, unspecified: Secondary | ICD-10-CM | POA: Diagnosis not present

## 2022-08-08 DIAGNOSIS — I10 Essential (primary) hypertension: Secondary | ICD-10-CM | POA: Diagnosis not present

## 2022-08-08 DIAGNOSIS — E1165 Type 2 diabetes mellitus with hyperglycemia: Secondary | ICD-10-CM | POA: Diagnosis not present

## 2022-08-08 DIAGNOSIS — E1149 Type 2 diabetes mellitus with other diabetic neurological complication: Secondary | ICD-10-CM | POA: Diagnosis not present

## 2022-08-08 DIAGNOSIS — I2510 Atherosclerotic heart disease of native coronary artery without angina pectoris: Secondary | ICD-10-CM | POA: Diagnosis not present

## 2022-08-11 DIAGNOSIS — L600 Ingrowing nail: Secondary | ICD-10-CM | POA: Diagnosis not present

## 2022-08-11 DIAGNOSIS — M792 Neuralgia and neuritis, unspecified: Secondary | ICD-10-CM | POA: Diagnosis not present

## 2022-09-06 DIAGNOSIS — I10 Essential (primary) hypertension: Secondary | ICD-10-CM | POA: Diagnosis not present

## 2022-09-06 DIAGNOSIS — E1165 Type 2 diabetes mellitus with hyperglycemia: Secondary | ICD-10-CM | POA: Diagnosis not present

## 2022-09-06 DIAGNOSIS — E785 Hyperlipidemia, unspecified: Secondary | ICD-10-CM | POA: Diagnosis not present

## 2022-09-06 DIAGNOSIS — E1149 Type 2 diabetes mellitus with other diabetic neurological complication: Secondary | ICD-10-CM | POA: Diagnosis not present

## 2022-10-27 DIAGNOSIS — E1142 Type 2 diabetes mellitus with diabetic polyneuropathy: Secondary | ICD-10-CM | POA: Diagnosis not present

## 2022-10-27 DIAGNOSIS — M792 Neuralgia and neuritis, unspecified: Secondary | ICD-10-CM | POA: Diagnosis not present

## 2022-10-27 DIAGNOSIS — L600 Ingrowing nail: Secondary | ICD-10-CM | POA: Diagnosis not present

## 2022-11-08 DIAGNOSIS — E1165 Type 2 diabetes mellitus with hyperglycemia: Secondary | ICD-10-CM | POA: Diagnosis not present

## 2022-11-15 DIAGNOSIS — E785 Hyperlipidemia, unspecified: Secondary | ICD-10-CM | POA: Diagnosis not present

## 2022-11-15 DIAGNOSIS — I10 Essential (primary) hypertension: Secondary | ICD-10-CM | POA: Diagnosis not present

## 2022-11-15 DIAGNOSIS — E1165 Type 2 diabetes mellitus with hyperglycemia: Secondary | ICD-10-CM | POA: Diagnosis not present

## 2022-12-26 DIAGNOSIS — R0989 Other specified symptoms and signs involving the circulatory and respiratory systems: Secondary | ICD-10-CM | POA: Diagnosis not present

## 2022-12-26 DIAGNOSIS — R053 Chronic cough: Secondary | ICD-10-CM | POA: Diagnosis not present

## 2023-01-24 DIAGNOSIS — I10 Essential (primary) hypertension: Secondary | ICD-10-CM | POA: Diagnosis not present

## 2023-01-24 DIAGNOSIS — E7800 Pure hypercholesterolemia, unspecified: Secondary | ICD-10-CM | POA: Diagnosis not present

## 2023-01-24 DIAGNOSIS — Z125 Encounter for screening for malignant neoplasm of prostate: Secondary | ICD-10-CM | POA: Diagnosis not present

## 2023-01-25 LAB — COMPREHENSIVE METABOLIC PANEL
Calcium: 9.7
EGFR: 63

## 2023-01-30 DIAGNOSIS — I10 Essential (primary) hypertension: Secondary | ICD-10-CM | POA: Diagnosis not present

## 2023-01-30 DIAGNOSIS — K862 Cyst of pancreas: Secondary | ICD-10-CM | POA: Diagnosis not present

## 2023-01-30 DIAGNOSIS — I2510 Atherosclerotic heart disease of native coronary artery without angina pectoris: Secondary | ICD-10-CM | POA: Diagnosis not present

## 2023-01-30 DIAGNOSIS — I6529 Occlusion and stenosis of unspecified carotid artery: Secondary | ICD-10-CM | POA: Diagnosis not present

## 2023-01-30 DIAGNOSIS — Z0000 Encounter for general adult medical examination without abnormal findings: Secondary | ICD-10-CM | POA: Diagnosis not present

## 2023-01-30 DIAGNOSIS — N200 Calculus of kidney: Secondary | ICD-10-CM | POA: Diagnosis not present

## 2023-01-30 DIAGNOSIS — E785 Hyperlipidemia, unspecified: Secondary | ICD-10-CM | POA: Diagnosis not present

## 2023-01-30 DIAGNOSIS — E1149 Type 2 diabetes mellitus with other diabetic neurological complication: Secondary | ICD-10-CM | POA: Diagnosis not present

## 2023-01-30 DIAGNOSIS — R0789 Other chest pain: Secondary | ICD-10-CM | POA: Diagnosis not present

## 2023-01-30 DIAGNOSIS — D539 Nutritional anemia, unspecified: Secondary | ICD-10-CM | POA: Diagnosis not present

## 2023-01-30 DIAGNOSIS — Z23 Encounter for immunization: Secondary | ICD-10-CM | POA: Diagnosis not present

## 2023-02-16 DIAGNOSIS — N401 Enlarged prostate with lower urinary tract symptoms: Secondary | ICD-10-CM | POA: Diagnosis not present

## 2023-02-16 DIAGNOSIS — Z8546 Personal history of malignant neoplasm of prostate: Secondary | ICD-10-CM | POA: Diagnosis not present

## 2023-02-16 DIAGNOSIS — R350 Frequency of micturition: Secondary | ICD-10-CM | POA: Diagnosis not present

## 2023-03-05 ENCOUNTER — Ambulatory Visit: Payer: Medicare Other | Attending: Cardiovascular Disease | Admitting: Cardiovascular Disease

## 2023-03-05 ENCOUNTER — Encounter: Payer: Self-pay | Admitting: Cardiovascular Disease

## 2023-03-05 VITALS — BP 128/66 | HR 75 | Ht 70.0 in | Wt 150.0 lb

## 2023-03-05 DIAGNOSIS — E7800 Pure hypercholesterolemia, unspecified: Secondary | ICD-10-CM | POA: Diagnosis not present

## 2023-03-05 DIAGNOSIS — E1140 Type 2 diabetes mellitus with diabetic neuropathy, unspecified: Secondary | ICD-10-CM | POA: Insufficient documentation

## 2023-03-05 DIAGNOSIS — I2510 Atherosclerotic heart disease of native coronary artery without angina pectoris: Secondary | ICD-10-CM | POA: Insufficient documentation

## 2023-03-05 DIAGNOSIS — R0602 Shortness of breath: Secondary | ICD-10-CM | POA: Diagnosis not present

## 2023-03-05 DIAGNOSIS — R079 Chest pain, unspecified: Secondary | ICD-10-CM | POA: Insufficient documentation

## 2023-03-05 NOTE — Progress Notes (Unsigned)
Cardiology Office Note:    Date:  03/05/2023   ID:  Vincent Kent, DOB 1945-05-13, MRN [id number]  PCP:  Merri Brunette, MD   [facility] Providers Cardiologist:  None     Referring MD: Merri Brunette, MD   No chief complaint on file. Vincent Kent is a 78 y.o. male who is being seen today for the evaluation of elevated coronary calcium score at the request of Merri Brunette, MD.   History of Present Illness:    Vincent Kent is a 78 y.o. male with a hx of nonobstructive carotid atherosclerosis, type 2 diabetes mellitus without complication, treated hypertension and hypercholesterolemia, degenerative cervical spine disc disease, remote history of prostate cancer and sciatica, referred for an abnormal coronary calcium score.  He does not have any history of symptomatic atherosclerotic disease.  Carotid atherosclerosis (bilateral plaque with less than 50% stenosis) was discovered incidentally by ultrasound following a CT performed for cervical spine disease.  He has never had a stroke or TIA.  He walks on a regular basis and in the summer as an avid gardener and has never had complaints of exertional angina or dyspnea.  He denies palpitations, dizziness, syncope, leg edema, claudication.  Has erectile dysfunction, responsive to PDE 5 inhibitors.  Coronary calcium score was markedly elevated at 2121 (89th percentile).  More than half of the measure of calcium plaque was in the right coronary artery.  His family history is significant for early onset CAD in his father at age 54 who was a heavy smoker.  No other family members have coronary disease.  He has a history of well treated type II diabetes mellitus and his most recent hemoglobin A1c was a little higher than usual at 7.2%.  He is taking both a GLP-1 agonist and a SGLT2 inhibitor as well as metformin and insulin [name]).  He is quite lean with a BMI of only 22.  He has been on atorvastatin 10 mg daily for many  years.  LDL cholesterol is 88 on labs performed 01/17/2021.  After his coronary calcium score results he was switched to rosuvastatin 20 mg daily.  Past Medical History:  Diagnosis Date   Carotid stenosis    Cervical disc disease    Cervical radiculopathy    Diabetes mellitus    type 2   Diabetes mellitus without complication (HCC)    Erectile dysfunction    Hepatic steatosis    History of kidney stones    Hyperlipidemia    Hypertension    Pelvic fracture (HCC) 2005   acetabulum fx also   Polyp of colon, adenomatous 10/27/2011   August 2013 - for f/u colonoscopy at 3 yrs   Prostate cancer [facility])    Sciatica 07/17/2011    Past Surgical History:  Procedure Laterality Date   bone spur  2012   cervical bone spur removal   HERNIA REPAIR     INGUINAL HERNIA REPAIR Bilateral 1990   left x 2,right 1   PROSTATE BIOPSY     RADIOACTIVE SEED IMPLANT N/A 03/19/2018   Procedure: RADIOACTIVE SEED IMPLANT/BRACHYTHERAPY IMPLANT;  Surgeon: Jerilee Field, MD;  Location: [facility];  Service: Urology;  Laterality: N/A;   SPACE OAR INSTILLATION N/A 03/19/2018   Procedure: SPACE OAR INSTILLATION;  Surgeon: Jerilee Field, MD;  Location: [facility];  Service: Urology;  Laterality: N/A;   TONSILLECTOMY  1949    Current Medications: Current Meds  Medication Sig   alfuzosin (UROXATRAL) 10 MG 24  hr tablet Take 1 tablet (10 mg total) by mouth daily with breakfast.   aspirin 81 MG tablet Take 81 mg by mouth daily.   BD PEN NEEDLE NANO 2ND GEN 32G X 4 MM MISC USE 1 NEEDLE FOR INJECTION ONCE DAILY   COVID-19 mRNA bivalent vaccine, Pfizer, (PFIZER COVID-19 VAC BIVALENT) injection Inject into the muscle.   cyanocobalamin (VITAMIN B12) 1000 MCG tablet Take 1,000 mcg by mouth daily.   Dapagliflozin-metFORMIN HCl ER (XIGDUO XR) 06-998 MG TB24 Take by mouth 2 (two) times daily.   glucose blood (ONE TOUCH ULTRA TEST) test strip Use as directed twice daily as directed.   Diagnosis code 250.02   Mirabegron (MYRBETRIQ PO)    rosuvastatin (CRESTOR) 20 MG tablet Take 20 mg by mouth daily.     Allergies:   Flomax [tamsulosin hcl], Tamsulosin, and Viagra [sildenafil citrate]   Social History   Socioeconomic History   Marital status: Married    Spouse name: Not on file   Number of children: 2   Years of education: 16   Highest education level: Not on file  Occupational History   Occupation: Education administrator, Environmental manager   Occupation: retired  Tobacco Use   Smoking status: Never   Smokeless tobacco: Never  Vaping Use   Vaping status: Never Used  Substance and Sexual Activity   Alcohol use: Yes    Comment: bottle of wine q 2 weeks   Drug use: No   Sexual activity: Not Currently  Other Topics Concern   Not on file  Social History Narrative   ** Merged History Encounter **       Social Drivers of Corporate investment banker Strain: Not on file  Food Insecurity: Not on file  Transportation Needs: No Transportation Needs (04/03/2018)   PRAPARE - Administrator, Civil Service (Medical): No    Lack of Transportation (Non-Medical): No  Physical Activity: Not on file  Stress: Not on file  Social Connections: Not on file     Family History: The patient's family history includes Cancer in his mother; Dementia in his mother; Heart disease in his father and mother; Stroke in his mother. There is no history of Colon cancer.  ROS:   Please see the history of present illness.     All other systems reviewed and are negative.  EKGs/Labs/Other Studies Reviewed:    The following studies were reviewed today: Coronary calcium score reviewed  EKG:  EKG is ordered today.  The ekg ordered today demonstrates sinus rhythm with a single PAC, otherwise normal tracing  Recent Labs: No results found for requested labs within last 365 days.  Recent Lipid Panel    Component Value Date/Time   CHOL 143 09/26/2012 0946   TRIG 90.0 09/26/2012 0946   HDL 43.20  09/26/2012 0946   CHOLHDL 3 09/26/2012 0946   VLDL 18.0 09/26/2012 0946   LDLCALC 82 09/26/2012 0946   LDLDIRECT 78.6 07/17/2011 1018   01/17/2021 creatinine 1.22, potassium 4.7, normal liver function tests Cholesterol 157, triglycerides 175, HDL 42, LDL 80  Risk Assessment/Calculations:           Physical Exam:    VS:  BP 128/66 (BP Location: Left Arm, Patient Position: Sitting)   Pulse 75   Ht 5\' 10"  (1.778 m)   Wt 150 lb (68 kg)   SpO2 96%   BMI 21.52 kg/m     Wt Readings from Last 3 Encounters:  03/05/23 150 lb (68 kg)  03/16/21  153 lb 3.2 oz (69.5 kg)  07/18/19 161 lb (73 kg)     GEN: Lean and fit, appears younger than stated age; well nourished, well developed in no acute distress HEENT: Normal NECK: No JVD; No carotid bruits LYMPHATICS: No lymphadenopathy CARDIAC: RRR, no murmurs, rubs, gallops RESPIRATORY:  Clear to auscultation without rales, wheezing or rhonchi  ABDOMEN: Soft, non-tender, non-distended MUSCULOSKELETAL:  No edema; No deformity  SKIN: Warm and dry NEUROLOGIC:  Alert and oriented x 3 PSYCHIATRIC:  Normal affect   ASSESSMENT:    1. Chest pain of uncertain etiology    PLAN:    In order of problems listed above:  Elevated coronary calcium score: Asymptomatic.  We will schedule for a treadmill stress test.  Risk factors have been very well managed by his PCP.  Ideally would get the hemoglobin A1c less than 7% and LDL cholesterol less than 70.  Appropriate changes were made to his statin prescription.  If the treadmill stress test is normal I would not advise further cardiac evaluation at this time, but if there is evidence of ischemia he will benefit from additional imaging studies. Carotid atherosclerosis: Documented on ultrasound from 2010, but without any evidence of significant stenosis.  The treatment is the same, focus on risk factor mitigation.  Asymptomatic. DM: Target LDL less than 7%, very close to that target.  I am pleased to see  that he is taking GLP-1 agonist and SGLT2 inhibitors which will provide the best outcome from cardiovascular disease. HLP: Recently switched to a more potent statin.  Suspect that on rosuvastatin 20 mg daily his LDL will indeed reach a target of less than 70.      Shared Decision Making/Informed Consent The risks [chest pain, shortness of breath, cardiac arrhythmias, dizziness, blood pressure fluctuations, myocardial infarction, stroke/transient ischemic attack, and life-threatening complications (estimated to be 1 in 10,000)], benefits (risk stratification, diagnosing coronary artery disease, treatment guidance) and alternatives of an exercise tolerance test were discussed in detail with Mr. Sudbeck and he agrees to proceed.    Medication Adjustments/Labs and Tests Ordered: Current medicines are reviewed at length with the patient today.  Concerns regarding medicines are outlined above.  Orders Placed This Encounter  Procedures   EKG 12-Lead   No orders of the defined types were placed in this encounter.   Patient Instructions  Medication Instructions:  *** *If you need a refill on your cardiac medications before your next appointment, please call your pharmacy*   Lab Work: *** If you have labs (blood work) drawn today and your tests are completely normal, you will receive your results only by: MyChart Message (if you have MyChart) OR A paper copy in the mail If you have any lab test that is abnormal or we need to change your treatment, we will call you to review the results.   Testing/Procedures: ***   Follow-Up: At [facility], you and your health needs are our priority.  As part of our continuing mission to provide you with exceptional heart care, we have created designated Provider Care Teams.  These Care Teams include your primary Cardiologist (physician) and Advanced Practice Providers (APPs -  Physician Assistants and Nurse Practitioners) who all work together  to provide you with the care you need, when you need it.  We recommend signing up for the patient portal called "MyChart".  Sign up information is provided on this After Visit Summary.  MyChart is used to connect with patients for Virtual Visits (Telemedicine).  Patients are able to view lab/test results, encounter notes, upcoming appointments, etc.  Non-urgent messages can be sent to your provider as well.   To learn more about what you can do with MyChart, go to [email or web address].    Your next appointment:   {numbers 1-12:10294} {Time; day/wk/mo/yr(s):9076}  Provider:   Dr Royann Shivers         Signed, Thurmon Fair, MD  03/05/2023 4:28 PM    [facility]

## 2023-03-05 NOTE — Patient Instructions (Signed)
 Medication Instructions:  No changes *If you need a refill on your cardiac medications before your next appointment, please call your pharmacy*  Testing/Procedures: Your physician has requested that you have an echocardiogram. Echocardiography is a painless test that uses sound waves to create images of your heart. It provides your doctor with information about the size and shape of your heart and how well your heart's chambers and valves are working. This procedure takes approximately one hour. There are no restrictions for this procedure. Please do NOT wear cologne, perfume, aftershave, or lotions (deodorant is allowed). Please arrive 15 minutes prior to your appointment time.  Please note: We ask at that you not bring children with you during ultrasound (echo/ vascular) testing. Due to room size and safety concerns, children are not allowed in the ultrasound rooms during exams. Our front office staff cannot provide observation of children in our lobby area while testing is being conducted. An adult accompanying a patient to their appointment will only be allowed in the ultrasound room at the discretion of the ultrasound technician under special circumstances. We apologize for any inconvenience.    Follow-Up: At [facility], you and your health needs are our priority.  As part of our continuing mission to provide you with exceptional heart care, we have created designated Provider Care Teams.  These Care Teams include your primary Cardiologist (physician) and Advanced Practice Providers (APPs -  Physician Assistants and Nurse Practitioners) who all work together to provide you with the care you need, when you need it.  We recommend signing up for the patient portal called "MyChart".  Sign up information is provided on this After Visit Summary.  MyChart is used to connect with patients for Virtual Visits (Telemedicine).  Patients are able to view lab/test results, encounter notes, upcoming  appointments, etc.  Non-urgent messages can be sent to your provider as well.   To learn more about what you can do with MyChart, go to [email or web address].    Your next appointment:   1 year(s)  Provider:   Dr Royann Shivers

## 2023-03-07 ENCOUNTER — Encounter: Payer: Self-pay | Admitting: Cardiovascular Disease

## 2023-03-07 DIAGNOSIS — B351 Tinea unguium: Secondary | ICD-10-CM | POA: Diagnosis not present

## 2023-03-07 DIAGNOSIS — E1142 Type 2 diabetes mellitus with diabetic polyneuropathy: Secondary | ICD-10-CM | POA: Diagnosis not present

## 2023-03-07 DIAGNOSIS — M792 Neuralgia and neuritis, unspecified: Secondary | ICD-10-CM | POA: Diagnosis not present

## 2023-03-14 DIAGNOSIS — E1165 Type 2 diabetes mellitus with hyperglycemia: Secondary | ICD-10-CM | POA: Diagnosis not present

## 2023-03-22 DIAGNOSIS — E785 Hyperlipidemia, unspecified: Secondary | ICD-10-CM | POA: Diagnosis not present

## 2023-03-22 DIAGNOSIS — I2510 Atherosclerotic heart disease of native coronary artery without angina pectoris: Secondary | ICD-10-CM | POA: Diagnosis not present

## 2023-03-22 DIAGNOSIS — E1165 Type 2 diabetes mellitus with hyperglycemia: Secondary | ICD-10-CM | POA: Diagnosis not present

## 2023-03-22 DIAGNOSIS — N1831 Chronic kidney disease, stage 3a: Secondary | ICD-10-CM | POA: Diagnosis not present

## 2023-03-27 ENCOUNTER — Ambulatory Visit: Payer: Medicare Other | Admitting: Cardiology

## 2023-03-28 ENCOUNTER — Encounter: Payer: Self-pay | Admitting: Cardiovascular Disease

## 2023-03-28 ENCOUNTER — Ambulatory Visit (HOSPITAL_COMMUNITY): Payer: Medicare Other | Attending: Cardiovascular Disease

## 2023-03-28 DIAGNOSIS — R0602 Shortness of breath: Secondary | ICD-10-CM | POA: Insufficient documentation

## 2023-03-28 LAB — ECHOCARDIOGRAM COMPLETE
Area-P 1/2: 2.48 cm2
S' Lateral: 2.3 cm

## 2023-04-02 ENCOUNTER — Encounter: Payer: Self-pay | Admitting: Cardiovascular Disease

## 2023-04-09 DIAGNOSIS — I2510 Atherosclerotic heart disease of native coronary artery without angina pectoris: Secondary | ICD-10-CM | POA: Diagnosis not present

## 2023-04-09 DIAGNOSIS — E1165 Type 2 diabetes mellitus with hyperglycemia: Secondary | ICD-10-CM | POA: Diagnosis not present

## 2023-04-09 DIAGNOSIS — N1831 Chronic kidney disease, stage 3a: Secondary | ICD-10-CM | POA: Diagnosis not present

## 2023-04-10 NOTE — Telephone Encounter (Signed)
 Called  and spoke  to patient . Patient is in agreement for appointment to set up   cardiac cath.  Appointment 04/20/23  at @:45 pm - aware to be at office by 2:30 pm

## 2023-04-10 NOTE — Telephone Encounter (Signed)
 Thanks, Jasmine December. He will probably need a video visit or in office visit with me and APP when he comes back, since it will be more than 30 days by the time we get him scheduled for cath. Feel free to take a DOD slot if I have any.

## 2023-04-20 ENCOUNTER — Encounter: Payer: Self-pay | Admitting: Nurse Practitioner

## 2023-04-20 ENCOUNTER — Ambulatory Visit: Payer: BLUE CROSS/BLUE SHIELD | Attending: Nurse Practitioner | Admitting: Nurse Practitioner

## 2023-04-20 VITALS — BP 118/66 | HR 80 | Ht 70.0 in | Wt 149.0 lb

## 2023-04-20 DIAGNOSIS — E785 Hyperlipidemia, unspecified: Secondary | ICD-10-CM

## 2023-04-20 DIAGNOSIS — I2510 Atherosclerotic heart disease of native coronary artery without angina pectoris: Secondary | ICD-10-CM | POA: Diagnosis not present

## 2023-04-20 DIAGNOSIS — E1140 Type 2 diabetes mellitus with diabetic neuropathy, unspecified: Secondary | ICD-10-CM | POA: Diagnosis not present

## 2023-04-20 DIAGNOSIS — R0609 Other forms of dyspnea: Secondary | ICD-10-CM

## 2023-04-20 DIAGNOSIS — I10 Essential (primary) hypertension: Secondary | ICD-10-CM | POA: Diagnosis not present

## 2023-04-20 NOTE — Patient Instructions (Addendum)
 Medication Instructions:  Your physician recommends that you continue on your current medications as directed. Please refer to the Current Medication list given to you today.  *If you need a refill on your cardiac medications before your next appointment, please call your pharmacy*   Lab Work: CBC, BMET today  Testing/Procedures: Your physician has requested that you have a cardiac Left & Right catheterization. Cardiac catheterization is used to diagnose and/or treat various heart conditions. Doctors may recommend this procedure for a number of different reasons. The most common reason is to evaluate chest pain. Chest pain can be a symptom of coronary artery disease (CAD), and cardiac catheterization can show whether plaque is narrowing or blocking your heart's arteries. This procedure is also used to evaluate the valves, as well as measure the blood flow and oxygen levels in different parts of your heart. Please follow instruction sheet, as given.     Follow-Up: At [facility], you and your health needs are our priority.  As part of our continuing mission to provide you with exceptional heart care, we have created designated Provider Care Teams.  These Care Teams include your primary Cardiologist (physician) and Advanced Practice Providers (APPs -  Physician Assistants and Nurse Practitioners) who all work together to provide you with the care you need, when you need it.  We recommend signing up for the patient portal called "MyChart".  Sign up information is provided on this After Visit Summary.  MyChart is used to connect with patients for Virtual Visits (Telemedicine).  Patients are able to view lab/test results, encounter notes, upcoming appointments, etc.  Non-urgent messages can be sent to your provider as well.   To learn more about what you can do with MyChart, go to [email or web address].    Your next appointment:   2-3 week(s)  Provider:   Bernadene Person, NP         Other Instructions . [facility] A DEPT OF [employer]. [facility] AT [facility] [address] [place] [address] Dept: [phone] Loc: [phone]  ALDEAN PIPE  04/20/2023  You are scheduled for a Cardiac Catheterization on Monday, March 17 with Dr. Cristal Deer End.  1. Please arrive at the [facility] (Main Entrance A) at [facility]: [address] [place], [place] [address] at 7:00 AM (This time is 2 hour(s) before your procedure to ensure your preparation).   Free valet parking service is available. You will check in at ADMITTING. The support person will be asked to wait in the waiting room.  It is OK to have someone drop you off and come back when you are ready to be discharged.    Special note: Every effort is made to have your procedure done on time. Please understand that emergencies sometimes delay scheduled procedures.  2. Diet: Do not eat solid foods after midnight.  The patient may have clear liquids until 5am upon the day of the procedure.  3. Labs:  CBC  BMP  today . You do not need to be fasting.  4. Medication instructions in preparation for your procedure:   Contrast Allergy: No   hold Dapagliflozin-Metformin 72 hours prior to CATH   Do not take glipizide 5 mg  the morning of procedure    On the morning of your procedure, take your Aspirin 81 mg and any morning medicines NOT listed above.  You may use sips of water.  5. Plan to go home the  same day, you will only stay overnight if medically necessary. 6. Bring a current list of your medications and current insurance cards. 7. You MUST have a responsible person to drive you home. 8. Someone MUST be with you the first 24 hours after you arrive home or your discharge will be delayed. 9. Please wear clothes that are easy to get on and off and wear slip-on shoes.  Thank you for allowing [place] to care for you!   -- [place]  Invasive Cardiovascular services

## 2023-04-20 NOTE — Progress Notes (Signed)
 Office Visit    Patient Name: Vincent Kent Date of Encounter: 04/20/2023  Primary Care Provider:  Merri Brunette, MD Primary Cardiologist:  Thurmon Fair, MD  Chief Complaint    78 year old male with a history of CAD, dyspnea on exertion, carotid atherosclerosis without significant stenosis, hypertension, hyperlipidemia, type 2 diabetes, prostate cancer, and EDs who presents for follow-up related to CAD and dyspnea on exertion.  Past Medical History    Past Medical History:  Diagnosis Date   Carotid stenosis    Cervical disc disease    Cervical radiculopathy    Diabetes mellitus    type 2   Diabetes mellitus without complication (HCC)    Erectile dysfunction    Hepatic steatosis    History of kidney stones    Hyperlipidemia    Hypertension    Pelvic fracture (HCC) 2005   acetabulum fx also   Polyp of colon, adenomatous 10/27/2011   August 2013 - for f/u colonoscopy at 3 yrs   Prostate cancer [facility])    Sciatica 07/17/2011   Past Surgical History:  Procedure Laterality Date   bone spur  2012   cervical bone spur removal   HERNIA REPAIR     INGUINAL HERNIA REPAIR Bilateral 1990   left x 2,right 1   PROSTATE BIOPSY     RADIOACTIVE SEED IMPLANT N/A 03/19/2018   Procedure: RADIOACTIVE SEED IMPLANT/BRACHYTHERAPY IMPLANT;  Surgeon: Jerilee Field, MD;  Location: [facility];  Service: Urology;  Laterality: N/A;   SPACE OAR INSTILLATION N/A 03/19/2018   Procedure: SPACE OAR INSTILLATION;  Surgeon: Jerilee Field, MD;  Location: [facility];  Service: Urology;  Laterality: N/A;   TONSILLECTOMY  1949    Allergies  Allergies  Allergen Reactions   Flomax [Tamsulosin Hcl] Other (See Comments)    Syncope and passed out   Tamsulosin     Other Reaction(s): Not available, passed out, Unknown   Viagra [Sildenafil Citrate] Other (See Comments)    headache     Labs/Other Studies Reviewed    The following studies were reviewed  today:  Cardiac Studies & Procedures   ______________________________________________________________________________________________   STRESS TESTS  EXERCISE TOLERANCE TEST (ETT) 03/30/2021  Narrative   No ST deviation was noted.  Normal ETT HTN response to exercise   ECHOCARDIOGRAM  ECHOCARDIOGRAM COMPLETE 03/28/2023  Narrative ECHOCARDIOGRAM REPORT    Patient Name:   Vincent Kent Date of Exam: 03/28/2023 Medical Rec #:  [id number]              Height:       70.0 in Accession #:    [id number]             Weight:       150.0 lb Date of Birth:  1945-06-24              BSA:          1.847 m Patient Age:    79 years               BP:           128/66 mmHg Patient Gender: M                      HR:           73 bpm. Exam Location:  [address]  Procedure: 2D Echo, 3D Echo and Strain Analysis (Both Spectral and Color Flow Doppler were utilized during procedure).  Indications:  R06.02 SOB  History:        Patient has prior history of Echocardiogram examinations, most recent 08/07/2008. Risk Factors:Hypertension, Diabetes and Dyslipidemia. Elevated coronary calcium score. Dyspnea on exertion.  Sonographer:    Cathie Beams RCS Referring Phys: [phone] MIHAI CROITORU  IMPRESSIONS   1. Left ventricular ejection fraction, by estimation, is 60 to 65%. The left ventricle has normal function. The left ventricle has no regional wall motion abnormalities. There is mild concentric left ventricular hypertrophy. Left ventricular diastolic parameters are consistent with Grade I diastolic dysfunction (impaired relaxation). The average left ventricular global longitudinal strain is -20.0 %. The global longitudinal strain is normal. 2. Right ventricular systolic function is normal. The right ventricular size is normal. There is normal pulmonary artery systolic pressure. The estimated right ventricular systolic pressure is 24.7 mmHg. 3. The mitral valve is normal in structure. No  evidence of mitral valve regurgitation. 4. The aortic valve is tricuspid. There is mild calcification of the aortic valve. Aortic valve regurgitation is not visualized. 5. The inferior vena cava is normal in size with greater than 50% respiratory variability, suggesting right atrial pressure of 3 mmHg.  FINDINGS Left Ventricle: Left ventricular ejection fraction, by estimation, is 60 to 65%. The left ventricle has normal function. The left ventricle has no regional wall motion abnormalities. The average left ventricular global longitudinal strain is -20.0 %. Strain was performed and the global longitudinal strain is normal. The left ventricular internal cavity size was normal in size. There is mild concentric left ventricular hypertrophy. Left ventricular diastolic parameters are consistent with Grade I diastolic dysfunction (impaired relaxation).  Right Ventricle: The right ventricular size is normal. Right ventricular systolic function is normal. There is normal pulmonary artery systolic pressure. The tricuspid regurgitant velocity is 2.33 m/s, and with an assumed right atrial pressure of 3 mmHg, the estimated right ventricular systolic pressure is 24.7 mmHg.  Left Atrium: Left atrial size was normal in size.  Right Atrium: Right atrial size was normal in size.  Pericardium: There is no evidence of pericardial effusion.  Mitral Valve: The mitral valve is normal in structure. No evidence of mitral valve regurgitation.  Tricuspid Valve: Tricuspid valve regurgitation is mild.  Aortic Valve: The aortic valve is tricuspid. There is mild calcification of the aortic valve. Aortic valve regurgitation is not visualized.  Pulmonic Valve: Pulmonic valve regurgitation is not visualized.  Aorta: The aortic root and ascending aorta are structurally normal, with no evidence of dilitation.  Venous: The inferior vena cava is normal in size with greater than 50% respiratory variability, suggesting right  atrial pressure of 3 mmHg.  IAS/Shunts: The interatrial septum was not well visualized.  Additional Comments: 3D was performed not requiring image post processing on an independent workstation and was normal.   LEFT VENTRICLE PLAX 2D LVIDd:         4.30 cm   Diastology LVIDs:         2.30 cm   LV e' medial:    6.64 cm/s LV PW:         1.20 cm   LV E/e' medial:  12.3 LV IVS:        1.10 cm   LV e' lateral:   6.09 cm/s LVOT diam:     2.00 cm   LV E/e' lateral: 13.4 LV SV:         73 LV SV Index:   39        2D Longitudinal Strain LVOT Area:  3.14 cm  2D Strain GLS Avg:     -20.0 %  3D Volume EF: 3D EF:        55 % LV EDV:       101 ml LV ESV:       45 ml LV SV:        56 ml  RIGHT VENTRICLE RV Basal diam:  2.30 cm RV S prime:     18.50 cm/s TAPSE (M-mode): 2.5 cm RVSP:           24.7 mmHg  LEFT ATRIUM           Index        RIGHT ATRIUM           Index LA diam:      3.50 cm 1.89 cm/m   RA Pressure: 3.00 mmHg LA Vol (A2C): 23.1 ml 12.51 ml/m  RA Area:     10.10 cm LA Vol (A4C): 21.5 ml 11.64 ml/m  RA Volume:   17.50 ml  9.47 ml/m AORTIC VALVE LVOT Vmax:   110.00 cm/s LVOT Vmean:  71.000 cm/s LVOT VTI:    0.232 m  AORTA Ao Root diam: 3.70 cm Ao Asc diam:  3.40 cm  MITRAL VALVE                TRICUSPID VALVE MV Area (PHT): 2.48 cm     TR Peak grad:   21.7 mmHg MV Decel Time: 306 msec     TR Vmax:        233.00 cm/s MV E velocity: 81.90 cm/s   Estimated RAP:  3.00 mmHg MV A velocity: 104.00 cm/s  RVSP:           24.7 mmHg MV E/A ratio:  0.79 SHUNTS Systemic VTI:  0.23 m Systemic Diam: 2.00 cm  Carolan Clines Electronically signed by Carolan Clines Signature Date/Time: 03/28/2023/9:32:07 AM    Final          ______________________________________________________________________________________________     Recent Labs: No results found for requested labs within last 365 days.  Recent Lipid Panel    Component Value Date/Time   CHOL 143 09/26/2012  0946   TRIG 90.0 09/26/2012 0946   HDL 43.20 09/26/2012 0946   CHOLHDL 3 09/26/2012 0946   VLDL 18.0 09/26/2012 0946   LDLCALC 82 09/26/2012 0946   LDLDIRECT 78.6 07/17/2011 1018    History of Present Illness    78 year old male with the above past medical history including CAD, dyspnea on exertion, carotid atherosclerosis without significant stenosis, hypertension, hyperlipidemia, type 2 diabetes, prostate cancer, and ED.  He has a history of elevated coronary calcium score (2121, 89th percentile), normal ETT in 2023.  He was last seen in the office on 03/05/2023 and reported increased exertional dyspnea.  Echocardiogram showed EF 60 to 65%, normal LV function, no RWMA, mild concentric LVH, G1 DD, normal RV systolic function, normal PASP, no significant valvular abnormalities.  Given ongoing exertional dyspnea, ischemic evaluation was recommended with either coronary CT angiogram or R/LHC.  Patient was agreeable to [facility].  He presents today for follow-up.  Since his last visit been stable from a cardiac standpoint.  He continues to note some mild dyspnea on exertion.  He denies chest pain, palpitations, dizziness, edema, PND, orthopnea, weight gain.  He is interested in pursuing cardiac catheterization as he previously discussed with Dr. Royann Shivers.  Home Medications    Current Outpatient Medications  Medication Sig Dispense Refill   alfuzosin (UROXATRAL) 10 MG  24 hr tablet Take 1 tablet (10 mg total) by mouth daily with breakfast. 30 tablet 1   aspirin 81 MG tablet Take 81 mg by mouth daily.     BD PEN NEEDLE NANO 2ND GEN 32G X 4 MM MISC USE 1 NEEDLE FOR INJECTION ONCE DAILY     COVID-19 mRNA bivalent vaccine, Pfizer, (PFIZER COVID-19 VAC BIVALENT) injection Inject into the muscle. 0.3 mL 0   cyanocobalamin (VITAMIN B12) 1000 MCG tablet Take 1,000 mcg by mouth daily.     Dapagliflozin-metFORMIN HCl ER (XIGDUO XR) 06-998 MG TB24 Take by mouth 2 (two) times daily.     GEMTESA 75 MG TABS Take  1 tablet by mouth daily.     glipiZIDE (GLUCOTROL XL) 5 MG 24 hr tablet Take 5 mg by mouth daily.     glucose blood (ONE TOUCH ULTRA TEST) test strip Use as directed twice daily as directed.  Diagnosis code 250.02 300 each 3   rosuvastatin (CRESTOR) 20 MG tablet Take 20 mg by mouth daily.     No current facility-administered medications for this visit.     Review of Systems   He denies chest pain, palpitations, pnd, orthopnea, n, v, dizziness, syncope, edema, weight gain, or early satiety. All other systems reviewed and are otherwise negative except as noted above.    Physical Exam    VS:  BP 118/66 (BP Location: Left Arm, Patient Position: Sitting, Cuff Size: Normal)   Pulse 80   Ht 5\' 10"  (1.778 m)   Wt 149 lb (67.6 kg)   SpO2 94%   BMI 21.38 kg/m   GEN: Well nourished, well developed, in no acute distress. HEENT: normal. Neck: Supple, no JVD, carotid bruits, or masses. Cardiac: RRR, no murmurs, rubs, or gallops. No clubbing, cyanosis, edema.  Radials/DP/PT 2+ and equal bilaterally.  Respiratory:  Respirations regular and unlabored, clear to auscultation bilaterally. GI: Soft, nontender, nondistended, BS + x 4. MS: no deformity or atrophy. Skin: warm and dry, no rash. Neuro:  Strength and sensation are intact. Psych: Normal affect.  Accessory Clinical Findings    ECG personally reviewed by me today - EKG Interpretation Date/Time:  Friday April 20 2023 14:47:10 EST Ventricular Rate:  80 PR Interval:  144 QRS Duration:  84 QT Interval:  372 QTC Calculation: 429 R Axis:   -29  Text Interpretation: Normal sinus rhythm with sinus arrhythmia Normal ECG When compared with ECG of 05-Mar-2023 16:10, No significant change was found Confirmed by Bernadene Person ([id number]) on 04/20/2023 3:08:55 PM  - no acute changes.   Lab Results  Component Value Date   WBC 8.7 03/12/2018   HGB 13.9 03/12/2018   HCT 41.8 03/12/2018   MCV 97.4 03/12/2018   PLT 198 03/12/2018   Lab Results   Component Value Date   CREATININE 1.26 (H) 03/12/2018   BUN 26 (H) 03/12/2018   NA 139 03/12/2018   K 4.4 03/12/2018   CL 106 03/12/2018   CO2 25 03/12/2018   Lab Results  Component Value Date   ALT 33 03/12/2018   AST 36 03/12/2018   ALKPHOS 54 03/12/2018   BILITOT 0.9 03/12/2018   Lab Results  Component Value Date   CHOL 143 09/26/2012   HDL 43.20 09/26/2012   LDLCALC 82 09/26/2012   LDLDIRECT 78.6 07/17/2011   TRIG 90.0 09/26/2012   CHOLHDL 3 09/26/2012    Lab Results  Component Value Date   HGBA1C 7.2 (H) 09/26/2012    Assessment & Plan  1. CAD/dyspnea on exertion: History of elevated coronary calcium score (2121, 89th percentile), normal ETT in 2023.  Recent echocardiogram in 02/2023 in the setting of increased dyspnea on exertion showed EF 60 to 65%, normal LV function, no RWMA, mild concentric LVH, G1 DD, normal RV systolic function, normal PASP, no significant valvular abnormalities.  Given ongoing exertional dyspnea, ischemic evaluation was recommended with either coronary CT angiogram or R/LHC.  Patient was agreeable to [facility], this was discussed with Dr. Royann Shivers.  We reviewed the risks and benefits, patient verbalized understanding and is agreeable to proceed.  Will update CBC, BMET today.  R/LHC scheduled for 04/30/2023 with Dr. Okey Dupre.  He will hold his Xigduo for 72 hours prior to procedure.  Continue aspirin, Crestor.  Informed Consent   Shared Decision Making/Informed Consent The risks [stroke (1 in 1000), death (1 in 1000), kidney failure [usually temporary] (1 in 500), bleeding (1 in 200), allergic reaction [possibly serious] (1 in 200)], benefits (diagnostic support and management of coronary artery disease) and alternatives of a cardiac catheterization were discussed in detail with Mr. Chaloux and he is willing to proceed.     2. Carotid atherosclerosis: No significant stenosis.  Asymptomatic.  Consider repeat testing as clinically indicated.  Continue  Crestor.  3. Hypertension: BP well controlled. Continue current antihypertensive regimen.   4. Hyperlipidemia: LDL was 59 in 01/2023.  Continue Crestor.  5. Type 2 diabetes: A1c was 8.3 in 02/2023.  Follows with endocrinology.  6. Disposition: Follow-up 2 weeks post cath.      Joylene Grapes, NP 04/20/2023, 3:19 PM

## 2023-04-21 LAB — CBC
Hematocrit: 36.1 % — ABNORMAL LOW (ref 37.5–51.0)
Hemoglobin: 12 g/dL — ABNORMAL LOW (ref 13.0–17.7)
MCH: 32.3 pg (ref 26.6–33.0)
MCHC: 33.2 g/dL (ref 31.5–35.7)
MCV: 97 fL (ref 79–97)
Platelets: 219 10*3/uL (ref 150–450)
RBC: 3.71 x10E6/uL — ABNORMAL LOW (ref 4.14–5.80)
RDW: 12.2 % (ref 11.6–15.4)
WBC: 8.1 10*3/uL (ref 3.4–10.8)

## 2023-04-21 LAB — BASIC METABOLIC PANEL
BUN/Creatinine Ratio: 23 (ref 10–24)
BUN: 28 mg/dL — ABNORMAL HIGH (ref 8–27)
CO2: 21 mmol/L (ref 20–29)
Calcium: 9.9 mg/dL (ref 8.6–10.2)
Chloride: 103 mmol/L (ref 96–106)
Creatinine, Ser: 1.22 mg/dL (ref 0.76–1.27)
Glucose: 153 mg/dL — ABNORMAL HIGH (ref 70–99)
Potassium: 4.8 mmol/L (ref 3.5–5.2)
Sodium: 140 mmol/L (ref 134–144)
eGFR: 60 mL/min/{1.73_m2} (ref >59)

## 2023-04-23 ENCOUNTER — Telehealth: Payer: Self-pay | Admitting: Cardiovascular Disease

## 2023-04-23 NOTE — Telephone Encounter (Signed)
 Patient has covid calling to get his surgery on 3/17 reschedule. Please advise

## 2023-04-23 NOTE — Telephone Encounter (Signed)
 Left message for patient to call, according to the cath lab, patient can have the procedure of 10 days out from testing positive and is fever free for 48 hours prior to procedure.

## 2023-04-23 NOTE — Telephone Encounter (Signed)
 Spoke with pt, he reports he has had symptoms since Saturday and tested positive this morning. Cath has been cancelled and he will call once symptoms improve and he test neg.

## 2023-04-30 ENCOUNTER — Telehealth: Payer: Self-pay | Admitting: Cardiovascular Disease

## 2023-04-30 ENCOUNTER — Encounter (HOSPITAL_COMMUNITY): Payer: Self-pay

## 2023-04-30 ENCOUNTER — Ambulatory Visit (HOSPITAL_COMMUNITY): Admit: 2023-04-30 | Admitting: Internal Medicine

## 2023-04-30 DIAGNOSIS — Z8546 Personal history of malignant neoplasm of prostate: Secondary | ICD-10-CM | POA: Diagnosis not present

## 2023-04-30 SURGERY — RIGHT/LEFT HEART CATH AND CORONARY ANGIOGRAPHY
Anesthesia: LOCAL

## 2023-04-30 NOTE — Telephone Encounter (Signed)
  Per MyChart Scheduling message:  Reschedule my Cardiac Catherization which was cancelled due to COVID. I will be away in [place] for the first 3 weeks in April, thus the first time I'm available is that 06/11/23 date.  That 05/16/23 [facility] appointment was originally set to be 2 weeks post-CT catheterization procedure. That procedure was cancelled due to my COVID diagnosis, thus I'm seeking to reschedule that catheterization procedure(which I presume will result in an appointment with [facility] 2 weeks post procedure).

## 2023-04-30 NOTE — Telephone Encounter (Signed)
 Can we please schedule him for a heart cath the week of April 28 and then a APP f/u visit 2 weeks later?

## 2023-05-02 DIAGNOSIS — Z8546 Personal history of malignant neoplasm of prostate: Secondary | ICD-10-CM | POA: Diagnosis not present

## 2023-05-02 DIAGNOSIS — R350 Frequency of micturition: Secondary | ICD-10-CM | POA: Diagnosis not present

## 2023-05-02 DIAGNOSIS — N5201 Erectile dysfunction due to arterial insufficiency: Secondary | ICD-10-CM | POA: Diagnosis not present

## 2023-05-02 DIAGNOSIS — N401 Enlarged prostate with lower urinary tract symptoms: Secondary | ICD-10-CM | POA: Diagnosis not present

## 2023-05-03 NOTE — Telephone Encounter (Signed)
 Left message for pt to call back. Pt will need an OV with APP or Dr. [name] for updated H&P prior to re-scheduling heart cath for late April.

## 2023-05-07 DIAGNOSIS — H26493 Other secondary cataract, bilateral: Secondary | ICD-10-CM | POA: Diagnosis not present

## 2023-05-07 DIAGNOSIS — Z961 Presence of intraocular lens: Secondary | ICD-10-CM | POA: Diagnosis not present

## 2023-05-08 NOTE — Telephone Encounter (Signed)
 Follow up scheduled

## 2023-05-16 ENCOUNTER — Ambulatory Visit: Admitting: Nurse Practitioner

## 2023-06-06 DIAGNOSIS — M792 Neuralgia and neuritis, unspecified: Secondary | ICD-10-CM | POA: Diagnosis not present

## 2023-06-06 DIAGNOSIS — E1142 Type 2 diabetes mellitus with diabetic polyneuropathy: Secondary | ICD-10-CM | POA: Diagnosis not present

## 2023-06-06 DIAGNOSIS — L84 Corns and callosities: Secondary | ICD-10-CM | POA: Diagnosis not present

## 2023-06-06 DIAGNOSIS — B351 Tinea unguium: Secondary | ICD-10-CM | POA: Diagnosis not present

## 2023-06-08 DIAGNOSIS — D649 Anemia, unspecified: Secondary | ICD-10-CM | POA: Diagnosis not present

## 2023-06-08 DIAGNOSIS — D539 Nutritional anemia, unspecified: Secondary | ICD-10-CM | POA: Diagnosis not present

## 2023-06-08 DIAGNOSIS — E1165 Type 2 diabetes mellitus with hyperglycemia: Secondary | ICD-10-CM | POA: Diagnosis not present

## 2023-06-13 ENCOUNTER — Encounter: Payer: Self-pay | Admitting: Cardiovascular Disease

## 2023-06-13 ENCOUNTER — Ambulatory Visit: Attending: Cardiovascular Disease | Admitting: Cardiovascular Disease

## 2023-06-13 VITALS — BP 120/60 | HR 69 | Ht 70.0 in | Wt 148.2 lb

## 2023-06-13 DIAGNOSIS — I2510 Atherosclerotic heart disease of native coronary artery without angina pectoris: Secondary | ICD-10-CM

## 2023-06-13 DIAGNOSIS — I10 Essential (primary) hypertension: Secondary | ICD-10-CM

## 2023-06-13 DIAGNOSIS — R0609 Other forms of dyspnea: Secondary | ICD-10-CM

## 2023-06-13 DIAGNOSIS — E1140 Type 2 diabetes mellitus with diabetic neuropathy, unspecified: Secondary | ICD-10-CM | POA: Diagnosis not present

## 2023-06-13 NOTE — H&P (View-Only) (Signed)
 Cardiology Office Note:    Date:  06/13/2023   ID:  Vincent Kent, DOB 1945/12/17, MRN [id number]  PCP:  Imelda Man, MD   [facility] Providers Cardiologist:  Luana Rumple, MD     Referring MD: Imelda Man, MD   Chief Complaint  Patient presents with   Shortness of Breath     History of Present Illness:    Vincent Kent is a 78 y.o. male with a history of elevated coronary calcium  score (2121, 89th%ile), but normal ECG stress test in 2023, nonobstructive carotid atherosclerosis, type 2 diabetes mellitus without complication, treated hypertension and dyslipidemia, degenerative cervical spine disc disease, remote history of prostate cancer and sciatica, presents with complaints of worsening dyspnea on exertion.  At his previous appointment he was complaining of a lot of difficulty performing his preferred form of exercise, which is gardening.  The previous year he had been less active having to take care of his wife that had a protracted recovery from Guillain-Barr syndrome.  This spring it seems that his symptoms are getting little better but he has not returned to his previous level of activity.  He still has at least mild shortness of breath.  While on vacation in [place] a few weeks ago he had a single episode of left shoulder and left arm pain and numbness that lasted for 3 to 4 minutes.  This occurred at rest.  It has not recurred since then.  He does not have any exertional chest or arm pain.  Coronary calcium  score was markedly elevated at 2121 (89th percentile).  More than half of the measure of calcium  plaque was in the right coronary artery.  His echocardiogram shows normal left ventricular systolic function and at most mild diastolic dysfunction (equivocal E/e' equals 12-13).  There were no significant valvular abnormalities and the estimated PA pressure was normal.  His family history is significant for early onset CAD in his father at age 52 who  was a heavy smoker.  No other family members have coronary disease.    Metabolic control is good with a hemoglobin A1c of 7.2%, HDL 56 and LDL 59, normal triglycerides.   Past Medical History:  Diagnosis Date   Carotid stenosis    Cervical disc disease    Cervical radiculopathy    Diabetes mellitus    type 2   Diabetes mellitus without complication (HCC)    Erectile dysfunction    Hepatic steatosis    History of kidney stones    Hyperlipidemia    Hypertension    Pelvic fracture (HCC) 2005   acetabulum fx also   Polyp of colon, adenomatous 10/27/2011   August 2013 - for f/u colonoscopy at 5 yrs   Prostate cancer [facility])    Sciatica 07/17/2011    Past Surgical History:  Procedure Laterality Date   bone spur  2012   cervical bone spur removal   HERNIA REPAIR     INGUINAL HERNIA REPAIR Bilateral 1990   left x 2,right 1   PROSTATE BIOPSY     RADIOACTIVE SEED IMPLANT N/A 03/19/2018   Procedure: RADIOACTIVE SEED IMPLANT/BRACHYTHERAPY IMPLANT;  Surgeon: Christina Coyer, MD;  Location: [facility] ;  Service: Urology;  Laterality: N/A;   SPACE OAR INSTILLATION N/A 03/19/2018   Procedure: SPACE OAR INSTILLATION;  Surgeon: Christina Coyer, MD;  Location: [facility];  Service: Urology;  Laterality: N/A;   TONSILLECTOMY  1949    Current Medications: Current Meds  Medication Sig  alfuzosin  (UROXATRAL ) 10 MG 24 hr tablet Take 1 tablet (10 mg total) by mouth daily with breakfast.   aspirin 81 MG tablet Take 81 mg by mouth daily.   cyanocobalamin  (VITAMIN B12) 1000 MCG tablet Take 1,000 mcg by mouth daily.   Dapagliflozin-metFORMIN  HCl ER (XIGDUO XR) 06-998 MG TB24 Take by mouth 2 (two) times daily.   GEMTESA 75 MG TABS Take 1 tablet by mouth daily.   glipiZIDE  (GLUCOTROL  XL) 5 MG 24 hr tablet Take 5 mg by mouth daily.   glucose blood (ONE TOUCH ULTRA TEST) test strip Use as directed twice daily as directed.  Diagnosis code 250.02   rosuvastatin (CRESTOR)  20 MG tablet Take 20 mg by mouth daily.     Allergies:   Flomax  [tamsulosin  hcl], Tamsulosin , and Viagra [sildenafil citrate]   Family History: The patient's family history includes Cancer in his mother; Dementia in his mother; Heart attack (age of onset: 55) in his father; Heart disease in his father and mother; Stroke in his mother. There is no history of Colon cancer.  ROS:   Please see the history of present illness.     All other systems reviewed and are negative.  EKGs/Labs/Other Studies Reviewed:    The following studies were reviewed today: Coronary calcium  score reviewed  EKG:    EKG Interpretation Date/Time:  Wednesday June 13 2023 12:44:53 EDT Ventricular Rate:  69 PR Interval:  142 QRS Duration:  78 QT Interval:  388 QTC Calculation: 415 R Axis:   -18  Text Interpretation: Normal sinus rhythm Normal ECG When compared with ECG of 20-Apr-2023 14:47, No significant change was found Confirmed by Reni Hausner ([id number]) on 06/13/2023 5:17:18 PM         Recent Labs: 04/20/2023: BUN 28; Creatinine, Ser 1.22; Hemoglobin 12.0; Platelets 219; Potassium 4.8; Sodium 140  Recent Lipid Panel    Component Value Date/Time   CHOL 143 09/26/2012 0946   TRIG 90.0 09/26/2012 0946   HDL 43.20 09/26/2012 0946   CHOLHDL 3 09/26/2012 0946   VLDL 18.0 09/26/2012 0946   LDLCALC 82 09/26/2012 0946   LDLDIRECT 78.6 07/17/2011 1018   01/17/2021 creatinine 1.22, potassium 4.7, normal liver function tests Cholesterol 157, triglycerides 175, HDL 42, LDL 80  11/08/2022 hemoglobin A1c 7.2%  01/24/2023 Cholesterol 140, HDL 56, LDL 59, triglycerides 149 Hemoglobin 11.6, MCV 100 Fasting glucose 142 Creatinine 1.18, potassium 4.7, normal liver function tests  Risk Assessment/Calculations:           Physical Exam:    VS:  BP 120/60 (BP Location: Left Arm, Patient Position: Sitting)   Pulse 69   Ht 5\' 10"  (1.778 m)   Wt 67.2 kg   BMI 21.26 kg/m     Wt Readings from Last 3  Encounters:  06/13/23 67.2 kg  04/20/23 67.6 kg  03/05/23 68 kg     GEN: Lean and fit, appears younger than stated age; well nourished, well developed in no acute distress HEENT: Normal NECK: No JVD; No carotid bruits LYMPHATICS: No lymphadenopathy CARDIAC: RRR, no murmurs, rubs, gallops RESPIRATORY:  Clear to auscultation without rales, wheezing or rhonchi  ABDOMEN: Soft, non-tender, non-distended MUSCULOSKELETAL:  No edema; No deformity  SKIN: Warm and dry NEUROLOGIC:  Alert and oriented x 3 PSYCHIATRIC:  Normal affect   ASSESSMENT:    1. Coronary artery disease involving native coronary artery of native heart without angina pectoris   2. Essential hypertension   3. Dyspnea on exertion   4. Type 2 diabetes  mellitus with diabetic neuropathy, without long-term current use of insulin  (HCC)    PLAN:    In order of problems listed above:  CAD: Markedly elevated coronary calcium  score places him in the risk category equivalent to established CAD.  He has only had 1 atypical episode of left shoulder/arm discomfort but has unexplained exertional dyspnea.  We have talked about additional ways to evaluate this and decided the next best step might be a right and left heart catheterization.  We reviewed the limitations of less invasive procedures such as stress testing and coronary CT angiography in a patient with such a high burden of calcified plaque.  He understands that we may find coronary stenoses that we decide not to treat with invasive/interventional procedures since he really does not have angina pectoris.  On the other hand if we find left main coronary stenosis or severe three-vessel disease, he may receive a recommendation for surgical bypass.No ischemic changes on treadmill ECG stress test in 2023.  Risk factors are quite well addressed, although trying to get his A1c less than 7%. Carotid atherosclerosis: Remains asymptomatic.  Documented on ultrasound from 2010, but without any  evidence of significant stenosis.  The focus is on risk factor mitigation.  DM: Target LDL less than 7%, was most recently 7.6%.  He has lost a lot of weight on GLP-1 agonists and SGLT2 inhibitors.  No longer taking Rybelsus, remains on dapagliflozin and metformin , but also taking glipizide . HLP: All lipid parameters are excellent.  Continue current statin prescription.     Informed Consent   Shared Decision Making/Informed Consent The risks [stroke (1 in 1000), death (1 in 1000), kidney failure [usually temporary] (1 in 500), bleeding (1 in 200), allergic reaction [possibly serious] (1 in 200)], benefits (diagnostic support and management of coronary artery disease) and alternatives of a cardiac catheterization were discussed in detail with Vincent Kent and he is willing to proceed.         Medication Adjustments/Labs and Tests Ordered: Current medicines are reviewed at length with the patient today.  Concerns regarding medicines are outlined above.  Orders Placed This Encounter  Procedures   EKG 12-Lead   No orders of the defined types were placed in this encounter.   Patient Instructions  Medication Instructions:  No changes *If you need a refill on your cardiac medications before your next appointment, please call your pharmacy*  Lab Work: Had CBC and BMP on 06/08/23  If you have labs (blood work) drawn today and your tests are completely normal, you will receive your results only by: MyChart Message (if you have MyChart) OR A paper copy in the mail If you have any lab test that is abnormal or we need to change your treatment, we will call you to review the results.  Testing/Procedures: R & Left Heart Catheterization Your physician has requested that you have a cardiac catheterization. Cardiac catheterization is used to diagnose and/or treat various heart conditions. Doctors may recommend this procedure for a number of different reasons. The most common reason is to evaluate  chest pain. Chest pain can be a symptom of coronary artery disease (CAD), and cardiac catheterization can show whether plaque is narrowing or blocking your heart's arteries. This procedure is also used to evaluate the valves, as well as measure the blood flow and oxygen levels in different parts of your heart. For further information please visit [email or web address] Please follow instruction sheet, as given.  Follow-Up: At [facility], you and your health needs  are our priority.  As part of our continuing mission to provide you with exceptional heart care, our providers are all part of one team.  This team includes your primary Cardiologist (physician) and Advanced Practice Providers or APPs (Physician Assistants and Nurse Practitioners) who all work together to provide you with the care you need, when you need it.  Your next appointment:   To be determined after heart catheterization   Provider:   Luana Rumple, MD    We recommend signing up for the patient portal called "MyChart".  Sign up information is provided on this After Visit Summary.  MyChart is used to connect with patients for Virtual Visits (Telemedicine).  Patients are able to view lab/test results, encounter notes, upcoming appointments, etc.  Non-urgent messages can be sent to your provider as well.   To learn more about what you can do with MyChart, go to [email or web address].          Cardiac/Peripheral Catheterization   You are scheduled for a Cardiac Catheterization on Wednesday, May 7 with Dr. Peter [place].  1. Please arrive at the [facility] (Main Entrance A) at [facility]: [address] [place], [place] [address] at 12:00 PM (This time is two hour(s) before your procedure to ensure your preparation).   Free valet parking service is available. You will check in at ADMITTING. The support person will be asked to wait in the waiting room.  It is OK to have someone drop you off and come back when  you are ready to be discharged.        Special note: Every effort is made to have your procedure done on time. Please understand that emergencies sometimes delay scheduled procedures.  2. Diet: Do not eat solid foods after midnight.  You may have clear liquids until 5 AM the day of the procedure.  3. Labs: NONE  4. Medication instructions in preparation for your procedure:   Contrast Allergy: No  DO NOT TAKE Glipizide  or Gemtesa the morning of procedure DO NOT TAKE Xigduo the day of procedure and HOLD for 48 hours after (last dose Tues 5/6, resume Sat 5/10) On the morning of your procedure, take Aspirin 81 mg and any morning medicines NOT listed above.  You may use sips of water.  5. Plan to go home the same day, you will only stay overnight if medically necessary. 6. You MUST have a responsible adult to drive you home. 7. An adult MUST be with you the first 24 hours after you arrive home. 8. Bring a current list of your medications, and the last time and date medication taken. 9. Bring ID and current insurance cards. 10.Please wear clothes that are easy to get on and off and wear slip-on shoes.  Thank you for allowing us  to care for you!   -- [facility]    Signed, Luana Rumple, MD  06/13/2023 5:22 PM    [facility]

## 2023-06-13 NOTE — Progress Notes (Signed)
 Cardiology Office Note:    Date:  06/13/2023   ID:  Vincent Kent, DOB 1945/12/17, MRN [id number]  PCP:  Imelda Man, MD   [facility] Providers Cardiologist:  Luana Rumple, MD     Referring MD: Imelda Man, MD   Chief Complaint  Patient presents with   Shortness of Breath     History of Present Illness:    Vincent Kent is a 78 y.o. male with a history of elevated coronary calcium  score (2121, 89th%ile), but normal ECG stress test in 2023, nonobstructive carotid atherosclerosis, type 2 diabetes mellitus without complication, treated hypertension and dyslipidemia, degenerative cervical spine disc disease, remote history of prostate cancer and sciatica, presents with complaints of worsening dyspnea on exertion.  At his previous appointment he was complaining of a lot of difficulty performing his preferred form of exercise, which is gardening.  The previous year he had been less active having to take care of his wife that had a protracted recovery from Guillain-Barr syndrome.  This spring it seems that his symptoms are getting little better but he has not returned to his previous level of activity.  He still has at least mild shortness of breath.  While on vacation in [place] a few weeks ago he had a single episode of left shoulder and left arm pain and numbness that lasted for 3 to 4 minutes.  This occurred at rest.  It has not recurred since then.  He does not have any exertional chest or arm pain.  Coronary calcium  score was markedly elevated at 2121 (89th percentile).  More than half of the measure of calcium  plaque was in the right coronary artery.  His echocardiogram shows normal left ventricular systolic function and at most mild diastolic dysfunction (equivocal E/e' equals 12-13).  There were no significant valvular abnormalities and the estimated PA pressure was normal.  His family history is significant for early onset CAD in his father at age 52 who  was a heavy smoker.  No other family members have coronary disease.    Metabolic control is good with a hemoglobin A1c of 7.2%, HDL 56 and LDL 59, normal triglycerides.   Past Medical History:  Diagnosis Date   Carotid stenosis    Cervical disc disease    Cervical radiculopathy    Diabetes mellitus    type 2   Diabetes mellitus without complication (HCC)    Erectile dysfunction    Hepatic steatosis    History of kidney stones    Hyperlipidemia    Hypertension    Pelvic fracture (HCC) 2005   acetabulum fx also   Polyp of colon, adenomatous 10/27/2011   August 2013 - for f/u colonoscopy at 5 yrs   Prostate cancer [facility])    Sciatica 07/17/2011    Past Surgical History:  Procedure Laterality Date   bone spur  2012   cervical bone spur removal   HERNIA REPAIR     INGUINAL HERNIA REPAIR Bilateral 1990   left x 2,right 1   PROSTATE BIOPSY     RADIOACTIVE SEED IMPLANT N/A 03/19/2018   Procedure: RADIOACTIVE SEED IMPLANT/BRACHYTHERAPY IMPLANT;  Surgeon: Christina Coyer, MD;  Location: [facility] ;  Service: Urology;  Laterality: N/A;   SPACE OAR INSTILLATION N/A 03/19/2018   Procedure: SPACE OAR INSTILLATION;  Surgeon: Christina Coyer, MD;  Location: [facility];  Service: Urology;  Laterality: N/A;   TONSILLECTOMY  1949    Current Medications: Current Meds  Medication Sig  alfuzosin  (UROXATRAL ) 10 MG 24 hr tablet Take 1 tablet (10 mg total) by mouth daily with breakfast.   aspirin 81 MG tablet Take 81 mg by mouth daily.   cyanocobalamin  (VITAMIN B12) 1000 MCG tablet Take 1,000 mcg by mouth daily.   Dapagliflozin-metFORMIN  HCl ER (XIGDUO XR) 06-998 MG TB24 Take by mouth 2 (two) times daily.   GEMTESA 75 MG TABS Take 1 tablet by mouth daily.   glipiZIDE  (GLUCOTROL  XL) 5 MG 24 hr tablet Take 5 mg by mouth daily.   glucose blood (ONE TOUCH ULTRA TEST) test strip Use as directed twice daily as directed.  Diagnosis code 250.02   rosuvastatin (CRESTOR)  20 MG tablet Take 20 mg by mouth daily.     Allergies:   Flomax  [tamsulosin  hcl], Tamsulosin , and Viagra [sildenafil citrate]   Family History: The patient's family history includes Cancer in his mother; Dementia in his mother; Heart attack (age of onset: 55) in his father; Heart disease in his father and mother; Stroke in his mother. There is no history of Colon cancer.  ROS:   Please see the history of present illness.     All other systems reviewed and are negative.  EKGs/Labs/Other Studies Reviewed:    The following studies were reviewed today: Coronary calcium  score reviewed  EKG:    EKG Interpretation Date/Time:  Wednesday June 13 2023 12:44:53 EDT Ventricular Rate:  69 PR Interval:  142 QRS Duration:  78 QT Interval:  388 QTC Calculation: 415 R Axis:   -18  Text Interpretation: Normal sinus rhythm Normal ECG When compared with ECG of 20-Apr-2023 14:47, No significant change was found Confirmed by Reni Hausner ([id number]) on 06/13/2023 5:17:18 PM         Recent Labs: 04/20/2023: BUN 28; Creatinine, Ser 1.22; Hemoglobin 12.0; Platelets 219; Potassium 4.8; Sodium 140  Recent Lipid Panel    Component Value Date/Time   CHOL 143 09/26/2012 0946   TRIG 90.0 09/26/2012 0946   HDL 43.20 09/26/2012 0946   CHOLHDL 3 09/26/2012 0946   VLDL 18.0 09/26/2012 0946   LDLCALC 82 09/26/2012 0946   LDLDIRECT 78.6 07/17/2011 1018   01/17/2021 creatinine 1.22, potassium 4.7, normal liver function tests Cholesterol 157, triglycerides 175, HDL 42, LDL 80  11/08/2022 hemoglobin A1c 7.2%  01/24/2023 Cholesterol 140, HDL 56, LDL 59, triglycerides 149 Hemoglobin 11.6, MCV 100 Fasting glucose 142 Creatinine 1.18, potassium 4.7, normal liver function tests  Risk Assessment/Calculations:           Physical Exam:    VS:  BP 120/60 (BP Location: Left Arm, Patient Position: Sitting)   Pulse 69   Ht 5\' 10"  (1.778 m)   Wt 67.2 kg   BMI 21.26 kg/m     Wt Readings from Last 3  Encounters:  06/13/23 67.2 kg  04/20/23 67.6 kg  03/05/23 68 kg     GEN: Lean and fit, appears younger than stated age; well nourished, well developed in no acute distress HEENT: Normal NECK: No JVD; No carotid bruits LYMPHATICS: No lymphadenopathy CARDIAC: RRR, no murmurs, rubs, gallops RESPIRATORY:  Clear to auscultation without rales, wheezing or rhonchi  ABDOMEN: Soft, non-tender, non-distended MUSCULOSKELETAL:  No edema; No deformity  SKIN: Warm and dry NEUROLOGIC:  Alert and oriented x 3 PSYCHIATRIC:  Normal affect   ASSESSMENT:    1. Coronary artery disease involving native coronary artery of native heart without angina pectoris   2. Essential hypertension   3. Dyspnea on exertion   4. Type 2 diabetes  mellitus with diabetic neuropathy, without long-term current use of insulin  (HCC)    PLAN:    In order of problems listed above:  CAD: Markedly elevated coronary calcium  score places him in the risk category equivalent to established CAD.  He has only had 1 atypical episode of left shoulder/arm discomfort but has unexplained exertional dyspnea.  We have talked about additional ways to evaluate this and decided the next best step might be a right and left heart catheterization.  We reviewed the limitations of less invasive procedures such as stress testing and coronary CT angiography in a patient with such a high burden of calcified plaque.  He understands that we may find coronary stenoses that we decide not to treat with invasive/interventional procedures since he really does not have angina pectoris.  On the other hand if we find left main coronary stenosis or severe three-vessel disease, he may receive a recommendation for surgical bypass.No ischemic changes on treadmill ECG stress test in 2023.  Risk factors are quite well addressed, although trying to get his A1c less than 7%. Carotid atherosclerosis: Remains asymptomatic.  Documented on ultrasound from 2010, but without any  evidence of significant stenosis.  The focus is on risk factor mitigation.  DM: Target LDL less than 7%, was most recently 7.6%.  He has lost a lot of weight on GLP-1 agonists and SGLT2 inhibitors.  No longer taking Rybelsus, remains on dapagliflozin and metformin , but also taking glipizide . HLP: All lipid parameters are excellent.  Continue current statin prescription.     Informed Consent   Shared Decision Making/Informed Consent The risks [stroke (1 in 1000), death (1 in 1000), kidney failure [usually temporary] (1 in 500), bleeding (1 in 200), allergic reaction [possibly serious] (1 in 200)], benefits (diagnostic support and management of coronary artery disease) and alternatives of a cardiac catheterization were discussed in detail with Mr. Lagunes and he is willing to proceed.         Medication Adjustments/Labs and Tests Ordered: Current medicines are reviewed at length with the patient today.  Concerns regarding medicines are outlined above.  Orders Placed This Encounter  Procedures   EKG 12-Lead   No orders of the defined types were placed in this encounter.   Patient Instructions  Medication Instructions:  No changes *If you need a refill on your cardiac medications before your next appointment, please call your pharmacy*  Lab Work: Had CBC and BMP on 06/08/23  If you have labs (blood work) drawn today and your tests are completely normal, you will receive your results only by: MyChart Message (if you have MyChart) OR A paper copy in the mail If you have any lab test that is abnormal or we need to change your treatment, we will call you to review the results.  Testing/Procedures: R & Left Heart Catheterization Your physician has requested that you have a cardiac catheterization. Cardiac catheterization is used to diagnose and/or treat various heart conditions. Doctors may recommend this procedure for a number of different reasons. The most common reason is to evaluate  chest pain. Chest pain can be a symptom of coronary artery disease (CAD), and cardiac catheterization can show whether plaque is narrowing or blocking your heart's arteries. This procedure is also used to evaluate the valves, as well as measure the blood flow and oxygen levels in different parts of your heart. For further information please visit [email or web address] Please follow instruction sheet, as given.  Follow-Up: At [facility], you and your health needs  are our priority.  As part of our continuing mission to provide you with exceptional heart care, our providers are all part of one team.  This team includes your primary Cardiologist (physician) and Advanced Practice Providers or APPs (Physician Assistants and Nurse Practitioners) who all work together to provide you with the care you need, when you need it.  Your next appointment:   To be determined after heart catheterization   Provider:   Luana Rumple, MD    We recommend signing up for the patient portal called "MyChart".  Sign up information is provided on this After Visit Summary.  MyChart is used to connect with patients for Virtual Visits (Telemedicine).  Patients are able to view lab/test results, encounter notes, upcoming appointments, etc.  Non-urgent messages can be sent to your provider as well.   To learn more about what you can do with MyChart, go to [email or web address].          Cardiac/Peripheral Catheterization   You are scheduled for a Cardiac Catheterization on Wednesday, May 7 with Dr. Peter Swaziland.  1. Please arrive at the [facility] (Main Entrance A) at [facility]: [address] [place], [place] [address] at 12:00 PM (This time is two hour(s) before your procedure to ensure your preparation).   Free valet parking service is available. You will check in at ADMITTING. The support person will be asked to wait in the waiting room.  It is OK to have someone drop you off and come back when  you are ready to be discharged.        Special note: Every effort is made to have your procedure done on time. Please understand that emergencies sometimes delay scheduled procedures.  2. Diet: Do not eat solid foods after midnight.  You may have clear liquids until 5 AM the day of the procedure.  3. Labs: NONE  4. Medication instructions in preparation for your procedure:   Contrast Allergy: No  DO NOT TAKE Glipizide  or Gemtesa the morning of procedure DO NOT TAKE Xigduo the day of procedure and HOLD for 48 hours after (last dose Tues 5/6, resume Sat 5/10) On the morning of your procedure, take Aspirin 81 mg and any morning medicines NOT listed above.  You may use sips of water.  5. Plan to go home the same day, you will only stay overnight if medically necessary. 6. You MUST have a responsible adult to drive you home. 7. An adult MUST be with you the first 24 hours after you arrive home. 8. Bring a current list of your medications, and the last time and date medication taken. 9. Bring ID and current insurance cards. 10.Please wear clothes that are easy to get on and off and wear slip-on shoes.  Thank you for allowing us  to care for you!   -- [place]    Signed, Luana Rumple, MD  06/13/2023 5:22 PM    [facility]

## 2023-06-13 NOTE — Patient Instructions (Signed)
 Medication Instructions:  No changes *If you need a refill on your cardiac medications before your next appointment, please call your pharmacy*  Lab Work: Had CBC and BMP on 06/08/23  If you have labs (blood work) drawn today and your tests are completely normal, you will receive your results only by: MyChart Message (if you have MyChart) OR A paper copy in the mail If you have any lab test that is abnormal or we need to change your treatment, we will call you to review the results.  Testing/Procedures: R & Left Heart Catheterization Your physician has requested that you have a cardiac catheterization. Cardiac catheterization is used to diagnose and/or treat various heart conditions. Doctors may recommend this procedure for a number of different reasons. The most common reason is to evaluate chest pain. Chest pain can be a symptom of coronary artery disease (CAD), and cardiac catheterization can show whether plaque is narrowing or blocking your heart's arteries. This procedure is also used to evaluate the valves, as well as measure the blood flow and oxygen levels in different parts of your heart. For further information please visit [email or web address] Please follow instruction sheet, as given.  Follow-Up: At [facility], you and your health needs are our priority.  As part of our continuing mission to provide you with exceptional heart care, our providers are all part of one team.  This team includes your primary Cardiologist (physician) and Advanced Practice Providers or APPs (Physician Assistants and Nurse Practitioners) who all work together to provide you with the care you need, when you need it.  Your next appointment:   To be determined after heart catheterization   Provider:   Luana Rumple, MD    We recommend signing up for the patient portal called "MyChart".  Sign up information is provided on this After Visit Summary.  MyChart is used to connect with patients for  Virtual Visits (Telemedicine).  Patients are able to view lab/test results, encounter notes, upcoming appointments, etc.  Non-urgent messages can be sent to your provider as well.   To learn more about what you can do with MyChart, go to [email or web address].          Cardiac/Peripheral Catheterization   You are scheduled for a Cardiac Catheterization on Wednesday, May 7 with Dr. Peter Swaziland.  1. Please arrive at the [facility] (Main Entrance A) at [facility]: [address] [place], [place] [address] at 12:00 PM (This time is two hour(s) before your procedure to ensure your preparation).   Free valet parking service is available. You will check in at ADMITTING. The support person will be asked to wait in the waiting room.  It is OK to have someone drop you off and come back when you are ready to be discharged.        Special note: Every effort is made to have your procedure done on time. Please understand that emergencies sometimes delay scheduled procedures.  2. Diet: Do not eat solid foods after midnight.  You may have clear liquids until 5 AM the day of the procedure.  3. Labs: NONE  4. Medication instructions in preparation for your procedure:   Contrast Allergy: No  DO NOT TAKE Glipizide  or Gemtesa the morning of procedure DO NOT TAKE Xigduo the day of procedure and HOLD for 48 hours after (last dose Tues 5/6, resume Sat 5/10) On the morning of your procedure, take Aspirin 81 mg and any morning medicines NOT listed above.  You may  use sips of water.  5. Plan to go home the same day, you will only stay overnight if medically necessary. 6. You MUST have a responsible adult to drive you home. 7. An adult MUST be with you the first 24 hours after you arrive home. 8. Bring a current list of your medications, and the last time and date medication taken. 9. Bring ID and current insurance cards. 10.Please wear clothes that are easy to get on and off and wear slip-on  shoes.  Thank you for allowing us  to care for you!   -- [place] [facility]

## 2023-06-19 ENCOUNTER — Telehealth: Payer: Self-pay | Admitting: *Deleted

## 2023-06-19 NOTE — Telephone Encounter (Signed)
 Patient is returning call. Please advise?

## 2023-06-19 NOTE — Telephone Encounter (Signed)
 06/08/23 CMP/CBC results in [facility].

## 2023-06-19 NOTE — Telephone Encounter (Addendum)
 Cardiac Catheterization scheduled at [facility] for: Wednesday Jun 20, 2023 2 PM Arrival time [facility] Main Entrance A at: 12 Noon  Nothing to eat after midnight prior to procedure, clear liquids until 5 AM day of procedure.  Medication instructions: -Hold:  Xigduo-day of procedure and 48 hours post procedure  Patient reports he may take Farxiga starting day after procedure until he can restart Xigduo.   Patient aware not to take Farxiga the day of the procedure.  Glipizide -AM of procedure  -Other usual morning medications can be taken with sips of water including aspirin 81 mg.  Plan to go home the same day, you will only stay overnight if medically necessary.  You must have responsible adult to drive you home.  Someone must be with you the first 24 hours after you arrive home.  Reviewed procedure instructions with patient.

## 2023-06-20 ENCOUNTER — Ambulatory Visit (HOSPITAL_COMMUNITY)
Admission: RE | Admit: 2023-06-20 | Discharge: 2023-06-20 | Disposition: A | Discharge status: Home / Self Care | Attending: Cardiology | Admitting: Cardiology

## 2023-06-20 ENCOUNTER — Other Ambulatory Visit: Payer: Self-pay

## 2023-06-20 ENCOUNTER — Encounter (HOSPITAL_COMMUNITY)
Admission: RE | Disposition: A | Discharge status: Home / Self Care | Payer: Self-pay | Source: Home / Self Care | Attending: Cardiology

## 2023-06-20 DIAGNOSIS — E785 Hyperlipidemia, unspecified: Secondary | ICD-10-CM | POA: Insufficient documentation

## 2023-06-20 DIAGNOSIS — Z8546 Personal history of malignant neoplasm of prostate: Secondary | ICD-10-CM | POA: Diagnosis not present

## 2023-06-20 DIAGNOSIS — I25118 Atherosclerotic heart disease of native coronary artery with other forms of angina pectoris: Secondary | ICD-10-CM | POA: Diagnosis not present

## 2023-06-20 DIAGNOSIS — Z7984 Long term (current) use of oral hypoglycemic drugs: Secondary | ICD-10-CM | POA: Insufficient documentation

## 2023-06-20 DIAGNOSIS — I10 Essential (primary) hypertension: Secondary | ICD-10-CM | POA: Diagnosis not present

## 2023-06-20 DIAGNOSIS — I2584 Coronary atherosclerosis due to calcified coronary lesion: Secondary | ICD-10-CM | POA: Diagnosis not present

## 2023-06-20 DIAGNOSIS — I2510 Atherosclerotic heart disease of native coronary artery without angina pectoris: Secondary | ICD-10-CM

## 2023-06-20 DIAGNOSIS — I6529 Occlusion and stenosis of unspecified carotid artery: Secondary | ICD-10-CM | POA: Insufficient documentation

## 2023-06-20 DIAGNOSIS — E1140 Type 2 diabetes mellitus with diabetic neuropathy, unspecified: Secondary | ICD-10-CM | POA: Insufficient documentation

## 2023-06-20 DIAGNOSIS — Z8249 Family history of ischemic heart disease and other diseases of the circulatory system: Secondary | ICD-10-CM | POA: Insufficient documentation

## 2023-06-20 DIAGNOSIS — Z79899 Other long term (current) drug therapy: Secondary | ICD-10-CM | POA: Diagnosis not present

## 2023-06-20 HISTORY — PX: LEFT HEART CATH AND CORONARY ANGIOGRAPHY: CATH118249

## 2023-06-20 LAB — GLUCOSE, CAPILLARY
Glucose-Capillary: 123 mg/dL — ABNORMAL HIGH (ref 70–99)
Glucose-Capillary: 113 mg/dL — ABNORMAL HIGH (ref 70–99)

## 2023-06-20 LAB — POCT I-STAT 7, (LYTES, BLD GAS, ICA,H+H)
Acid-base deficit: 4 mmol/L — ABNORMAL HIGH (ref 0.0–2.0)
Bicarbonate: 20.2 mmol/L (ref 20.0–28.0)
Calcium, Ion: 1.18 mmol/L (ref 1.15–1.40)
HCT: 31 % — ABNORMAL LOW (ref 39.0–52.0)
Hemoglobin: 10.5 g/dL — ABNORMAL LOW (ref 13.0–17.0)
O2 Saturation: 97 %
Potassium: 4 mmol/L (ref 3.5–5.1)
Sodium: 142 mmol/L (ref 135–145)
TCO2: 21 mmol/L — ABNORMAL LOW (ref 22–32)
pCO2 arterial: 31.5 mmHg — ABNORMAL LOW (ref 32–48)
pH, Arterial: 7.415 (ref 7.35–7.45)
pO2, Arterial: 86 mmHg (ref 83–108)

## 2023-06-20 SURGERY — LEFT HEART CATH AND CORONARY ANGIOGRAPHY
Anesthesia: LOCAL

## 2023-06-20 MED ORDER — FENTANYL CITRATE (PF) 100 MCG/2ML IJ SOLN
INTRAMUSCULAR | Status: AC
  Filled 2023-06-20: qty 2

## 2023-06-20 MED ORDER — FENTANYL CITRATE (PF) 100 MCG/2ML IJ SOLN
INTRAMUSCULAR | Status: DC | PRN
  Administered 2023-06-20 (×3): 25 ug via INTRAVENOUS

## 2023-06-20 MED ORDER — ACETAMINOPHEN 325 MG PO TABS: 650.0000 mg | ORAL_TABLET | ORAL | Status: DC | PRN

## 2023-06-20 MED ORDER — VERAPAMIL HCL 2.5 MG/ML IV SOLN
INTRAVENOUS | Status: AC
  Filled 2023-06-20: qty 2

## 2023-06-20 MED ORDER — SODIUM CHLORIDE 0.9 % WEIGHT BASED INFUSION
3.0000 mL/kg/h | INTRAVENOUS | Status: AC
Start: 2023-06-20 — End: 2023-06-20

## 2023-06-20 MED ORDER — SODIUM CHLORIDE 0.9% FLUSH: 3.0000 mL | Freq: Two times a day (BID) | INTRAVENOUS | Status: DC

## 2023-06-20 MED ORDER — DAPAGLIFLOZIN PRO-METFORMIN ER 5-1000 MG PO TB24: 1.0000 | ORAL_TABLET | Freq: Two times a day (BID) | ORAL | Status: AC

## 2023-06-20 MED ORDER — LIDOCAINE HCL (PF) 1 % IJ SOLN
INTRAMUSCULAR | Status: AC
  Filled 2023-06-20: qty 30

## 2023-06-20 MED ORDER — HEPARIN SODIUM (PORCINE) 1000 UNIT/ML IJ SOLN
INTRAMUSCULAR | Status: DC | PRN
  Administered 2023-06-20: 3500 [IU] via INTRAVENOUS

## 2023-06-20 MED ORDER — MIDAZOLAM HCL 2 MG/2ML IJ SOLN
INTRAMUSCULAR | Status: AC
  Filled 2023-06-20: qty 2

## 2023-06-20 MED ORDER — SODIUM CHLORIDE 0.9 % WEIGHT BASED INFUSION: 1.0000 mL/kg/h | INTRAVENOUS | Status: DC

## 2023-06-20 MED ORDER — VERAPAMIL HCL 2.5 MG/ML IV SOLN
INTRAVENOUS | Status: DC | PRN
  Administered 2023-06-20: 10 mL via INTRA_ARTERIAL

## 2023-06-20 MED ORDER — ONDANSETRON HCL 4 MG/2ML IJ SOLN: 4.0000 mg | Freq: Four times a day (QID) | INTRAMUSCULAR | Status: DC | PRN

## 2023-06-20 MED ORDER — HEPARIN (PORCINE) IN NACL 1000-0.9 UT/500ML-% IV SOLN
INTRAVENOUS | Status: DC | PRN
  Administered 2023-06-20: 1000 mL

## 2023-06-20 MED ORDER — ASPIRIN 81 MG PO CHEW: 81.0000 mg | CHEWABLE_TABLET | ORAL | Status: AC

## 2023-06-20 MED ORDER — SODIUM CHLORIDE 0.9 % IV SOLN: INTRAVENOUS | Status: DC

## 2023-06-20 MED ORDER — HEPARIN SODIUM (PORCINE) 1000 UNIT/ML IJ SOLN
INTRAMUSCULAR | Status: AC
  Filled 2023-06-20: qty 10

## 2023-06-20 MED ORDER — SODIUM CHLORIDE 0.9 % IV SOLN: 250.0000 mL | INTRAVENOUS | Status: DC | PRN

## 2023-06-20 MED ORDER — LIDOCAINE HCL (PF) 1 % IJ SOLN
INTRAMUSCULAR | Status: DC | PRN
  Administered 2023-06-20 (×2): 2 mL

## 2023-06-20 MED ORDER — IOHEXOL 350 MG/ML SOLN
INTRAVENOUS | Status: DC | PRN
  Administered 2023-06-20: 50 mL

## 2023-06-20 MED ORDER — MIDAZOLAM HCL 2 MG/2ML IJ SOLN
INTRAMUSCULAR | Status: DC | PRN
  Administered 2023-06-20: 2 mg via INTRAVENOUS
  Administered 2023-06-20: 1 mg via INTRAVENOUS

## 2023-06-20 MED ORDER — SODIUM CHLORIDE 0.9% FLUSH: 3.0000 mL | INTRAVENOUS | Status: DC | PRN

## 2023-06-20 SURGICAL SUPPLY — 11 items
CATH 5FR JL3.5 JR4 ANG PIG MP (CATHETERS) IMPLANT
CATH BALLN WEDGE 5F 110CM (CATHETERS) IMPLANT
DEVICE RAD COMP TR BAND LRG (VASCULAR PRODUCTS) IMPLANT
GLIDESHEATH SLEND SS 6F .021 (SHEATH) IMPLANT
GUIDEWIRE .025 260CM (WIRE) IMPLANT
GUIDEWIRE INQWIRE 1.5J.035X260 (WIRE) IMPLANT
KIT SYRINGE INJ CVI SPIKEX1 (MISCELLANEOUS) IMPLANT
PACK CARDIAC CATHETERIZATION (CUSTOM PROCEDURE TRAY) ×1 IMPLANT
SET ATX-X65L (MISCELLANEOUS) IMPLANT
SHEATH GLIDE SLENDER 4/5FR (SHEATH) IMPLANT
SHEATH PROBE COVER 6X72 (BAG) IMPLANT

## 2023-06-20 NOTE — Interval H&P Note (Signed)
 History and Physical Interval Note:  06/20/2023 7:18 AM  Vincent Kent  has presented today for surgery, with the diagnosis of Coronary Artery Disease.  The various methods of treatment have been discussed with the patient and family. After consideration of risks, benefits and other options for treatment, the patient has consented to  Procedure(s): RIGHT/LEFT HEART CATH AND CORONARY ANGIOGRAPHY (N/A) as a surgical intervention.  The patient's history has been reviewed, patient examined, no change in status, stable for surgery.  I have reviewed the patient's chart and labs.  Questions were answered to the patient's satisfaction.   Cath Lab Visit (complete for each Cath Lab visit)  Clinical Evaluation Leading to the Procedure:   ACS: No.  Non-ACS:    Anginal Classification: CCS I  Anti-ischemic medical therapy: No Therapy  Non-Invasive Test Results: No non-invasive testing performed  Prior CABG: No previous CABG        Donata Fryer [facility] 06/20/2023 7:18 AM

## 2023-06-21 ENCOUNTER — Encounter (HOSPITAL_COMMUNITY): Payer: Self-pay | Admitting: Cardiology

## 2023-06-26 DIAGNOSIS — L239 Allergic contact dermatitis, unspecified cause: Secondary | ICD-10-CM | POA: Diagnosis not present

## 2023-06-26 DIAGNOSIS — T50995A Adverse effect of other drugs, medicaments and biological substances, initial encounter: Secondary | ICD-10-CM | POA: Diagnosis not present

## 2023-06-27 DIAGNOSIS — I6529 Occlusion and stenosis of unspecified carotid artery: Secondary | ICD-10-CM | POA: Diagnosis not present

## 2023-06-27 DIAGNOSIS — E785 Hyperlipidemia, unspecified: Secondary | ICD-10-CM | POA: Diagnosis not present

## 2023-06-27 DIAGNOSIS — E1165 Type 2 diabetes mellitus with hyperglycemia: Secondary | ICD-10-CM | POA: Diagnosis not present

## 2023-07-10 ENCOUNTER — Ambulatory Visit: Attending: Nurse Practitioner | Admitting: Nurse Practitioner

## 2023-07-10 ENCOUNTER — Encounter: Payer: Self-pay | Admitting: Nurse Practitioner

## 2023-07-10 VITALS — BP 108/64 | HR 74 | Ht 70.0 in | Wt 145.6 lb

## 2023-07-10 DIAGNOSIS — E1140 Type 2 diabetes mellitus with diabetic neuropathy, unspecified: Secondary | ICD-10-CM | POA: Diagnosis not present

## 2023-07-10 DIAGNOSIS — I2510 Atherosclerotic heart disease of native coronary artery without angina pectoris: Secondary | ICD-10-CM

## 2023-07-10 DIAGNOSIS — E785 Hyperlipidemia, unspecified: Secondary | ICD-10-CM

## 2023-07-10 DIAGNOSIS — I10 Essential (primary) hypertension: Secondary | ICD-10-CM | POA: Diagnosis not present

## 2023-07-10 NOTE — Patient Instructions (Signed)
 Medication Instructions:  Your physician recommends that you continue on your current medications as directed. Please refer to the Current Medication list given to you today.  *If you need a refill on your cardiac medications before your next appointment, please call your pharmacy*  Lab Work: NONE ordered at this time of appointment   Testing/Procedures: NONE ordered at this time of appointment   Follow-Up: At [facility], you and your health needs are our priority.  As part of our continuing mission to provide you with exceptional heart care, our providers are all part of one team.  This team includes your primary Cardiologist (physician) and Advanced Practice Providers or APPs (Physician Assistants and Nurse Practitioners) who all work together to provide you with the care you need, when you need it.  Your next appointment:   6 Months (Dr. Alvis Ba) 1 month or sooner Pharm- D Provider:   Luana Rumple, MD    We recommend signing up for the patient portal called "MyChart".  Sign up information is provided on this After Visit Summary.  MyChart is used to connect with patients for Virtual Visits (Telemedicine).  Patients are able to view lab/test results, encounter notes, upcoming appointments, etc.  Non-urgent messages can be sent to your provider as well.   To learn more about what you can do with MyChart, go to [email or web address].   Other Instructions

## 2023-07-10 NOTE — Progress Notes (Signed)
 Office Visit    Patient Name: Vincent Kent Date of Encounter: 07/10/2023  Primary Care Provider:  Imelda Man, MD Primary Cardiologist:  Luana Rumple, MD  Chief Complaint    78 year old male with a history of CAD, dyspnea on exertion, carotid atherosclerosis without significant stenosis, hypertension, hyperlipidemia, type 2 diabetes, prostate cancer, and ED who presents for follow-up related to CAD.   Past Medical History    Past Medical History:  Diagnosis Date   Carotid stenosis    Cervical disc disease    Cervical radiculopathy    Diabetes mellitus    type 2   Diabetes mellitus without complication (HCC)    Erectile dysfunction    Hepatic steatosis    History of kidney stones    Hyperlipidemia    Hypertension    Pelvic fracture (HCC) 2005   acetabulum fx also   Polyp of colon, adenomatous 10/27/2011   August 2013 - for f/u colonoscopy at 3 yrs   Prostate cancer [facility])    Sciatica 07/17/2011   Past Surgical History:  Procedure Laterality Date   bone spur  2012   cervical bone spur removal   HERNIA REPAIR     INGUINAL HERNIA REPAIR Bilateral 1990   left x 2,right 1   LEFT HEART CATH AND CORONARY ANGIOGRAPHY N/A 06/20/2023   Procedure: LEFT HEART CATH AND CORONARY ANGIOGRAPHY;  Surgeon: [place], Peter M, MD;  Location: [facility];  Service: Cardiovascular;  Laterality: N/A;   PROSTATE BIOPSY     RADIOACTIVE SEED IMPLANT N/A 03/19/2018   Procedure: RADIOACTIVE SEED IMPLANT/BRACHYTHERAPY IMPLANT;  Surgeon: Christina Coyer, MD;  Location: [facility];  Service: Urology;  Laterality: N/A;   SPACE OAR INSTILLATION N/A 03/19/2018   Procedure: SPACE OAR INSTILLATION;  Surgeon: Christina Coyer, MD;  Location: [facility];  Service: Urology;  Laterality: N/A;   TONSILLECTOMY  1949    Allergies  Allergies  Allergen Reactions   Flomax  [Tamsulosin  Hcl] Other (See Comments)    Syncope and passed out   Viagra [Sildenafil  Citrate] Other (See Comments)    headache     Labs/Other Studies Reviewed    The following studies were reviewed today:  Cardiac Studies & Procedures   ______________________________________________________________________________________________ CARDIAC CATHETERIZATION  CARDIAC CATHETERIZATION 06/20/2023  Conclusion   Dist LM lesion is 40% stenosed.   Prox LAD lesion is 70% stenosed.   2nd Mrg lesion is 60% stenosed.   Prox RCA to Mid RCA lesion is 40% stenosed.   The left ventricular systolic function is normal.   LV end diastolic pressure is normal.   The left ventricular ejection fraction is 55-65% by visual estimate.  Severe coronary artery calcification Modest single vessel disease in the proximal LAD Normal LV function Low LVEDP Unable to perform right heart cath- unable to advance catheter from brachial approach  Plan: recommend medical therapy given lack of symptoms.  Findings Coronary Findings Diagnostic  Dominance: Right  Left Main Dist LM lesion is 40% stenosed.  Left Anterior Descending Prox LAD lesion is 70% stenosed. The lesion is severely calcified.  Left Circumflex  Second Obtuse Marginal Branch 2nd Mrg lesion is 60% stenosed.  Right Coronary Artery Vessel is large. The vessel is severely calcified. Prox RCA to Mid RCA lesion is 40% stenosed.  Intervention  No interventions have been documented.   STRESS TESTS  EXERCISE TOLERANCE TEST (ETT) 03/30/2021  Narrative   No ST deviation was noted.  Normal ETT HTN response to exercise  ECHOCARDIOGRAM  ECHOCARDIOGRAM COMPLETE 03/28/2023  Narrative ECHOCARDIOGRAM REPORT    Patient Name:   Vincent Kent Date of Exam: 03/28/2023 Medical Rec #:  [id number]              Height:       70.0 in Accession #:    [id number]             Weight:       150.0 lb Date of Birth:  11-08-1945              BSA:          1.847 m Patient Age:    48 years               BP:           128/66  mmHg Patient Gender: M                      HR:           73 bpm. Exam Location:  [address]  Procedure: 2D Echo, 3D Echo and Strain Analysis (Both Spectral and Color Flow Doppler were utilized during procedure).  Indications:    R06.02 SOB  History:        Patient has prior history of Echocardiogram examinations, most recent 08/07/2008. Risk Factors:Hypertension, Diabetes and Dyslipidemia. Elevated coronary calcium  score. Dyspnea on exertion.  Sonographer:    Mylinda Asa RCS Referring Phys: [phone] MIHAI CROITORU  IMPRESSIONS   1. Left ventricular ejection fraction, by estimation, is 60 to 65%. The left ventricle has normal function. The left ventricle has no regional wall motion abnormalities. There is mild concentric left ventricular hypertrophy. Left ventricular diastolic parameters are consistent with Grade I diastolic dysfunction (impaired relaxation). The average left ventricular global longitudinal strain is -20.0 %. The global longitudinal strain is normal. 2. Right ventricular systolic function is normal. The right ventricular size is normal. There is normal pulmonary artery systolic pressure. The estimated right ventricular systolic pressure is 24.7 mmHg. 3. The mitral valve is normal in structure. No evidence of mitral valve regurgitation. 4. The aortic valve is tricuspid. There is mild calcification of the aortic valve. Aortic valve regurgitation is not visualized. 5. The inferior vena cava is normal in size with greater than 50% respiratory variability, suggesting right atrial pressure of 3 mmHg.  FINDINGS Left Ventricle: Left ventricular ejection fraction, by estimation, is 60 to 65%. The left ventricle has normal function. The left ventricle has no regional wall motion abnormalities. The average left ventricular global longitudinal strain is -20.0 %. Strain was performed and the global longitudinal strain is normal. The left ventricular internal cavity size was normal  in size. There is mild concentric left ventricular hypertrophy. Left ventricular diastolic parameters are consistent with Grade I diastolic dysfunction (impaired relaxation).  Right Ventricle: The right ventricular size is normal. Right ventricular systolic function is normal. There is normal pulmonary artery systolic pressure. The tricuspid regurgitant velocity is 2.33 m/s, and with an assumed right atrial pressure of 3 mmHg, the estimated right ventricular systolic pressure is 24.7 mmHg.  Left Atrium: Left atrial size was normal in size.  Right Atrium: Right atrial size was normal in size.  Pericardium: There is no evidence of pericardial effusion.  Mitral Valve: The mitral valve is normal in structure. No evidence of mitral valve regurgitation.  Tricuspid Valve: Tricuspid valve regurgitation is mild.  Aortic Valve: The aortic valve is tricuspid. There is mild calcification of the  aortic valve. Aortic valve regurgitation is not visualized.  Pulmonic Valve: Pulmonic valve regurgitation is not visualized.  Aorta: The aortic root and ascending aorta are structurally normal, with no evidence of dilitation.  Venous: The inferior vena cava is normal in size with greater than 50% respiratory variability, suggesting right atrial pressure of 3 mmHg.  IAS/Shunts: The interatrial septum was not well visualized.  Additional Comments: 3D was performed not requiring image post processing on an independent workstation and was normal.   LEFT VENTRICLE PLAX 2D LVIDd:         4.30 cm   Diastology LVIDs:         2.30 cm   LV e' medial:    6.64 cm/s LV PW:         1.20 cm   LV E/e' medial:  12.3 LV IVS:        1.10 cm   LV e' lateral:   6.09 cm/s LVOT diam:     2.00 cm   LV E/e' lateral: 13.4 LV SV:         73 LV SV Index:   39        2D Longitudinal Strain LVOT Area:     3.14 cm  2D Strain GLS Avg:     -20.0 %  3D Volume EF: 3D EF:        55 % LV EDV:       101 ml LV ESV:       45 ml LV  SV:        56 ml  RIGHT VENTRICLE RV Basal diam:  2.30 cm RV S prime:     18.50 cm/s TAPSE (M-mode): 2.5 cm RVSP:           24.7 mmHg  LEFT ATRIUM           Index        RIGHT ATRIUM           Index LA diam:      3.50 cm 1.89 cm/m   RA Pressure: 3.00 mmHg LA Vol (A2C): 23.1 ml 12.51 ml/m  RA Area:     10.10 cm LA Vol (A4C): 21.5 ml 11.64 ml/m  RA Volume:   17.50 ml  9.47 ml/m AORTIC VALVE LVOT Vmax:   110.00 cm/s LVOT Vmean:  71.000 cm/s LVOT VTI:    0.232 m  AORTA Ao Root diam: 3.70 cm Ao Asc diam:  3.40 cm  MITRAL VALVE                TRICUSPID VALVE MV Area (PHT): 2.48 cm     TR Peak grad:   21.7 mmHg MV Decel Time: 306 msec     TR Vmax:        233.00 cm/s MV E velocity: 81.90 cm/s   Estimated RAP:  3.00 mmHg MV A velocity: 104.00 cm/s  RVSP:           24.7 mmHg MV E/A ratio:  0.79 SHUNTS Systemic VTI:  0.23 m Systemic Diam: 2.00 cm  Vincent Kent Electronically signed by Vincent Kent Signature Date/Time: 03/28/2023/9:32:07 AM    Final          ______________________________________________________________________________________________     Recent Labs: 04/20/2023: BUN 28; Creatinine, Ser 1.22; Platelets 219 06/20/2023: Hemoglobin 10.5; Potassium 4.0; Sodium 142  Recent Lipid Panel    Component Value Date/Time   CHOL 143 09/26/2012 0946   TRIG 90.0 09/26/2012 0946   HDL 43.20 09/26/2012 0946  CHOLHDL 3 09/26/2012 0946   VLDL 18.0 09/26/2012 0946   LDLCALC 82 09/26/2012 0946   LDLDIRECT 78.6 07/17/2011 1018    History of Present Illness    78 year old male with the above past medical history including CAD, dyspnea on exertion, carotid atherosclerosis without significant stenosis, hypertension, hyperlipidemia, type 2 diabetes, prostate cancer, and ED.   He has a history of elevated coronary calcium  score (2121, 89th percentile), normal ETT in 2023.  He was last seen in the office on 03/05/2023 and reported increased exertional dyspnea.  Echocardiogram  showed EF 60 to 65%, normal LV function, no RWMA, mild concentric LVH, G1 DD, normal RV systolic function, normal PASP, no significant valvular abnormalities.  He was last seen in the office on 06/13/2023 and was stable from a cardiac standpoint.  Given ongoing exertional dyspnea, severe coronary artery calcification, he underwent LHC on 06/20/2023, which revealed severe coronary artery calcification, moderate single-vessel disease in the proximal LAD (70% stenosed), 40% distal left main, 60% OM 2, 40% proximal and mid RCA stenoses, normal LV function, low LVEDP.  Medical therapy was advised.   He presents today for follow-up.  It [birth year] today. Since his procedure he has done well from a cardiac standpoint. He denies any symptoms concerning for angina.  He reports an allergic reaction following his catheterization. He had a pruritic rash that covered his back. He contacted our office, and was advised to follow-up with his PCP. He questions whether or not this was a reaction to the dye used during the procedure. He states he he has received contrast dye in the past without any problems. Otherwise, he reports feeling well.  Home Medications    Current Outpatient Medications  Medication Sig Dispense Refill   alfuzosin  (UROXATRAL ) 10 MG 24 hr tablet Take 1 tablet (10 mg total) by mouth daily with breakfast. 30 tablet 1   aspirin  81 MG tablet Take 81 mg by mouth daily.     cyanocobalamin  (VITAMIN B12) 1000 MCG tablet Take 1,000 mcg by mouth daily.     Dapagliflozin  Pro-metFORMIN  ER (XIGDUO  XR) 06-998 MG TB24 Take 1 tablet by mouth 2 (two) times daily.     GEMTESA 75 MG TABS Take 1 tablet by mouth daily.     glipiZIDE  (GLUCOTROL  XL) 5 MG 24 hr tablet Take 5 mg by mouth daily as needed (blood sugar above 130 in the morning or above 150 at night).     glucose blood (ONE TOUCH ULTRA TEST) test strip Use as directed twice daily as directed.  Diagnosis code 250.02 300 each 3   rosuvastatin (CRESTOR) 20 MG  tablet Take 20 mg by mouth daily.     No current facility-administered medications for this visit.     Review of Systems    He denies chest pain, palpitations, dyspnea, pnd, orthopnea, n, v, dizziness, syncope, edema, weight gain, or early satiety. All other systems reviewed and are otherwise negative except as noted above.   Physical Exam    VS:  BP 108/64   Pulse 74   Ht 5\' 10"  (1.778 m)   Wt 145 lb 9.6 oz (66 kg)   SpO2 94%   BMI 20.89 kg/m   GEN: Well nourished, well developed, in no acute distress. HEENT: normal. Neck: Supple, no JVD, carotid bruits, or masses. Cardiac: RRR, no murmurs, rubs, or gallops. No clubbing, cyanosis, edema.  Radials/DP/PT 2+ and equal bilaterally.  Right radial cath site without bruising, bleeding, bruit or hematoma. Respiratory:  Respirations  regular and unlabored, clear to auscultation bilaterally. GI: Soft, nontender, nondistended, BS + x 4. MS: no deformity or atrophy. Skin: warm and dry, no rash. Neuro:  Strength and sensation are intact. Psych: Normal affect.  Accessory Clinical Findings    ECG personally reviewed by me today - EKG Interpretation Date/Time:  Tuesday Jul 10 2023 08:56:03 EDT Ventricular Rate:  74 PR Interval:  136 QRS Duration:  78 QT Interval:  384 QTC Calculation: 426 R Axis:   -2  Text Interpretation: Normal sinus rhythm Normal ECG When compared with ECG of 13-Jun-2023 12:44, No significant change was found Confirmed by Marlana Silvan ([id number]) on 07/10/2023 8:56:39 AM  - no acute changes.   Lab Results  Component Value Date   WBC 8.1 04/20/2023   HGB 10.5 (L) 06/20/2023   HCT 31.0 (L) 06/20/2023   MCV 97 04/20/2023   PLT 219 04/20/2023   Lab Results  Component Value Date   CREATININE 1.22 04/20/2023   BUN 28 (H) 04/20/2023   NA 142 06/20/2023   K 4.0 06/20/2023   CL 103 04/20/2023   CO2 21 04/20/2023   Lab Results  Component Value Date   ALT 33 03/12/2018   AST 36 03/12/2018   ALKPHOS 54 03/12/2018    BILITOT 0.9 03/12/2018   Lab Results  Component Value Date   CHOL 143 09/26/2012   HDL 43.20 09/26/2012   LDLCALC 82 09/26/2012   LDLDIRECT 78.6 07/17/2011   TRIG 90.0 09/26/2012   CHOLHDL 3 09/26/2012    Lab Results  Component Value Date   HGBA1C 7.2 (H) 09/26/2012    Assessment & Plan   1. CAD: History of elevated coronary calcium  score (2121, 89th percentile), normal ETT in 2023.  Recent echocardiogram in 02/2023 in the setting of increased dyspnea on exertion showed EF 60 to 65%, normal LV function, no RWMA, mild concentric LVH, G1 DD, normal RV systolic function, normal PASP, no significant valvular abnormalities.  LHC on 06/20/2023 which revealed severe coronary artery calcification, moderate single-vessel disease in the proximal LAD (70% stenosed), 40% distal left main, 60% OM 2, 40% proximal and mid RCA stenoses, normal LV function, low LVEDP.  Medical therapy was advised.  He reports a pruritic rash following his procedure, he questions whether or not this was a reaction to the dye that was used.  He has received IV contrast in the past with no documented reaction.  However, should he have a future cardiac catheterization, he would likely benefit from premeds.  Currently denies any symptoms concerning for angina.  We discussed prevention strategies, through shared decision making, will refer to lipid clinic Pharm.D. for consideration of PCSK9 inhibitor, goal LDL less than 55 given significant coronary artery calcification.  Continue aspirin , Crestor.   2. Carotid atherosclerosis: No significant stenosis. Asymptomatic. Consider repeat testing as clinically indicated.  Continue Crestor.   3. Hypertension: BP well controlled. Continue current antihypertensive regimen.    4. Hyperlipidemia: LDL was 59 in 01/2023.  Will refer to lipid clinic Pharm.D. as above.  Continue Crestor.   5. Type 2 diabetes: A1c was 8.3 in 02/2023.  Follows with endocrinology.   6. Disposition: Follow-up in 6  months, sooner if needed.      Jude Norton, NP 07/10/2023, 10:21 AM

## 2023-07-11 ENCOUNTER — Ambulatory Visit: Attending: Cardiovascular Disease | Admitting: Pharmacist Clinician (PhC)/ Clinical Pharmacy Specialist

## 2023-07-11 ENCOUNTER — Encounter: Payer: Self-pay | Admitting: Pharmacist Clinician (PhC)/ Clinical Pharmacy Specialist

## 2023-07-11 DIAGNOSIS — E782 Mixed hyperlipidemia: Secondary | ICD-10-CM | POA: Diagnosis not present

## 2023-07-11 NOTE — Progress Notes (Signed)
 Office Visit    Patient Name: Vincent Kent Date of Encounter: 07/11/2023  Primary Care Provider:  Imelda Man, MD Primary Cardiologist:  Luana Rumple, MD  Chief Complaint    Hyperlipidemia   Significant Past Medical History   CAD CAC 2121 (89th) percentile  HTN Controlled w/o meds  DM2 4/25 A1c 7.6 on dapagliflozin /metformin , glipizide      Allergies  Allergen Reactions   Flomax  [Tamsulosin  Hcl] Other (See Comments)    Syncope and passed out   Viagra [Sildenafil Citrate] Other (See Comments)    headache    History of Present Illness    Vincent Kent is a 78 y.o. male patient of Dr Alvis Ba, in the office today to discuss options for cholesterol management.  He was seen yesterday by Marlana Silvan and they discussed his LDL not being quite to goal, with history of DM and CAC at 2121.  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[employer], commercial     LDL Cholesterol goal:  LDL < 55  Current Medications:  rosuvastatin 20 mg daily  Family Hx:   father had 3 MI's died from dementia at 50, mother had heart disease, cancer, stroke; no siblings, 2 daughters, both healthy  Social Hx: Tobacco: no Alcohol: occasional wine      Diet:  mostly home cooked; variety of protein, no fish; fresh vegetables; occasional snacking  Exercise: 2 days deep water aerobics, gardens 6-7 months per year  Accessory Clinical Findings   12/24 TC  140, TG 149, HDL 56, LDL 59  No results found for: "LIPOA"  Lab Results  Component Value Date   ALT 33 03/12/2018   AST 36 03/12/2018   ALKPHOS 54 03/12/2018   BILITOT 0.9 03/12/2018   Lab Results  Component Value Date   CREATININE 1.22 04/20/2023   BUN 28 (H) 04/20/2023   NA 142 06/20/2023   K 4.0 06/20/2023   CL 103 04/20/2023   CO2 21 04/20/2023   Lab Results  Component Value Date   HGBA1C 7.2 (H) 09/26/2012    Home Medications    Current Outpatient Medications  Medication Sig Dispense Refill   alfuzosin  (UROXATRAL ) 10 MG 24 hr  tablet Take 1 tablet (10 mg total) by mouth daily with breakfast. 30 tablet 1   aspirin  81 MG tablet Take 81 mg by mouth daily.     cyanocobalamin  (VITAMIN B12) 1000 MCG tablet Take 1,000 mcg by mouth daily.     Dapagliflozin  Pro-metFORMIN  ER (XIGDUO  XR) 06-998 MG TB24 Take 1 tablet by mouth 2 (two) times daily.     GEMTESA 75 MG TABS Take 1 tablet by mouth daily.     glipiZIDE  (GLUCOTROL  XL) 5 MG 24 hr tablet Take 5 mg by mouth daily as needed (blood sugar above 130 in the morning or above 150 at night).     glucose blood (ONE TOUCH ULTRA TEST) test strip Use as directed twice daily as directed.  Diagnosis code 250.02 300 each 3   rosuvastatin (CRESTOR) 20 MG tablet Take 20 mg by mouth daily.     No current facility-administered medications for this visit.     Assessment & Plan    Hyperlipidemia Assessment: Patient with ASCVD not at LDL goal of < 55 Most recent LDL 59 on 12/20024 Has been compliant with high intensity statin : rosuvastatin 20 mg daily Reviewed options for lowering LDL cholesterol, including ezetimibe, PCSK-9 inhibitors, bempedoic acid and inclisiran.  Discussed mechanisms of action, dosing, side effects, potential decreases in LDL cholesterol and  costs.  Also reviewed potential options for patient assistance.  Plan: Patient would like to think over all the given information and review his options with wife. Will send MyChart message once he makes a decision.     Janaisa Birkland, PharmD CPP [facility] [address]  [place], [place] [address] [phone]  07/11/2023, 3:03 PM

## 2023-07-11 NOTE — Assessment & Plan Note (Signed)
 Assessment: Patient with ASCVD not at LDL goal of < 55 Most recent LDL 59 on 12/20024 Has been compliant with high intensity statin : rosuvastatin 20 mg daily Reviewed options for lowering LDL cholesterol, including ezetimibe, PCSK-9 inhibitors, bempedoic acid and inclisiran.  Discussed mechanisms of action, dosing, side effects, potential decreases in LDL cholesterol and costs.  Also reviewed potential options for patient assistance.  Plan: Patient would like to think over all the given information and review his options with wife. Will send MyChart message once he makes a decision.

## 2023-07-11 NOTE — Patient Instructions (Addendum)
 Your Results:             Your most recent labs Goal  Total Cholesterol 140 < 200  Triglycerides 149 < 150  HDL (happy/good cholesterol) 56 > 40  LDL (lousy/bad cholesterol 59 < 55   Consider the options with your wife:  ezetimibe (daily tablet, lowers LDL 10-20%)      Repatha (injection every 14 d, lowers LDL 50-70%      Leqvio (injection every 3-6 months, lowers LDL 40-60%      Nexlizet (tablet, risk of gout, lowers LDL 30-40%   Thank you for choosing [facility]

## 2023-07-13 ENCOUNTER — Telehealth: Payer: Self-pay | Admitting: Pharmacy Technician

## 2023-07-13 ENCOUNTER — Telehealth: Payer: Self-pay | Admitting: Pharmacist Clinician (PhC)/ Clinical Pharmacy Specialist

## 2023-07-13 ENCOUNTER — Other Ambulatory Visit (HOSPITAL_COMMUNITY): Payer: Self-pay

## 2023-07-13 DIAGNOSIS — E782 Mixed hyperlipidemia: Secondary | ICD-10-CM

## 2023-07-13 NOTE — Telephone Encounter (Signed)
 Pharmacy Patient Advocate Encounter  Received notification from [employer] that Prior Authorization for Repatha has been APPROVED from 02/14/23 to 07/12/24. Ran test claim, Copay is $0.00- one month. This test claim was processed through [facility]- copay amounts may vary at other pharmacies due to pharmacy/plan contracts, or as the patient moves through the different stages of their insurance plan.   PA #/Case ID/Reference #: [id number]

## 2023-07-13 NOTE — Telephone Encounter (Signed)
 Pharmacy Patient Advocate Encounter   Received notification from Pt Calls Messages that prior authorization for repatha is required/requested.   Insurance verification completed.   The patient is insured through [employer] .   Per test claim: PA required; PA submitted to above mentioned insurance via CoverMyMeds Key/confirmation #/EOC [id number] Status is pending

## 2023-07-13 NOTE — Telephone Encounter (Signed)
 Please do PA for Repatha

## 2023-07-19 MED ORDER — REPATHA SURECLICK 140 MG/ML ~~LOC~~ SOAJ: 140.0000 mg | SUBCUTANEOUS | 3 refills | Status: AC

## 2023-07-19 NOTE — Addendum Note (Signed)
 Addended by: Amana Bouska L on: 07/19/2023 08:44 AM   Modules accepted: Orders

## 2023-09-11 ENCOUNTER — Encounter: Payer: Self-pay | Admitting: Pharmacist Clinician (PhC)/ Clinical Pharmacy Specialist

## 2023-10-18 DIAGNOSIS — M2041 Other hammer toe(s) (acquired), right foot: Secondary | ICD-10-CM | POA: Diagnosis not present

## 2023-10-18 DIAGNOSIS — B351 Tinea unguium: Secondary | ICD-10-CM | POA: Diagnosis not present

## 2023-10-18 DIAGNOSIS — L84 Corns and callosities: Secondary | ICD-10-CM | POA: Diagnosis not present

## 2023-10-18 DIAGNOSIS — M792 Neuralgia and neuritis, unspecified: Secondary | ICD-10-CM | POA: Diagnosis not present

## 2023-10-18 DIAGNOSIS — E1142 Type 2 diabetes mellitus with diabetic polyneuropathy: Secondary | ICD-10-CM | POA: Diagnosis not present

## 2023-10-31 DIAGNOSIS — I6529 Occlusion and stenosis of unspecified carotid artery: Secondary | ICD-10-CM | POA: Diagnosis not present

## 2023-10-31 DIAGNOSIS — E1165 Type 2 diabetes mellitus with hyperglycemia: Secondary | ICD-10-CM | POA: Diagnosis not present

## 2023-11-05 DIAGNOSIS — I6529 Occlusion and stenosis of unspecified carotid artery: Secondary | ICD-10-CM | POA: Diagnosis not present

## 2023-11-05 DIAGNOSIS — E785 Hyperlipidemia, unspecified: Secondary | ICD-10-CM | POA: Diagnosis not present

## 2023-11-05 DIAGNOSIS — E1165 Type 2 diabetes mellitus with hyperglycemia: Secondary | ICD-10-CM | POA: Diagnosis not present

## 2023-11-05 DIAGNOSIS — N1831 Chronic kidney disease, stage 3a: Secondary | ICD-10-CM | POA: Diagnosis not present

## 2023-11-05 DIAGNOSIS — I10 Essential (primary) hypertension: Secondary | ICD-10-CM | POA: Diagnosis not present

## 2023-11-09 ENCOUNTER — Encounter: Payer: Self-pay | Admitting: Pharmacist Clinician (PhC)/ Clinical Pharmacy Specialist

## 2023-11-22 DIAGNOSIS — R1012 Left upper quadrant pain: Secondary | ICD-10-CM | POA: Diagnosis not present

## 2023-11-22 DIAGNOSIS — Z23 Encounter for immunization: Secondary | ICD-10-CM | POA: Diagnosis not present

## 2023-11-22 DIAGNOSIS — F5104 Psychophysiologic insomnia: Secondary | ICD-10-CM | POA: Diagnosis not present

## 2023-12-24 DIAGNOSIS — N3941 Urge incontinence: Secondary | ICD-10-CM | POA: Diagnosis not present

## 2023-12-24 DIAGNOSIS — C61 Malignant neoplasm of prostate: Secondary | ICD-10-CM | POA: Diagnosis not present

## 2023-12-24 DIAGNOSIS — N5201 Erectile dysfunction due to arterial insufficiency: Secondary | ICD-10-CM | POA: Diagnosis not present

## 2023-12-24 DIAGNOSIS — N401 Enlarged prostate with lower urinary tract symptoms: Secondary | ICD-10-CM | POA: Diagnosis not present

## 2023-12-27 DIAGNOSIS — F5104 Psychophysiologic insomnia: Secondary | ICD-10-CM | POA: Diagnosis not present

## 2023-12-27 DIAGNOSIS — R1012 Left upper quadrant pain: Secondary | ICD-10-CM | POA: Diagnosis not present

## 2024-01-28 DIAGNOSIS — I10 Essential (primary) hypertension: Secondary | ICD-10-CM | POA: Diagnosis not present

## 2024-02-20 ENCOUNTER — Encounter: Payer: Self-pay | Admitting: Cardiovascular Disease

## 2024-03-03 NOTE — Progress Notes (Unsigned)
 [facility] CONSULT NOTE  Patient Care Team: Clarice Nottingham, MD as PCP - General (Internal Medicine) Croitoru, Jerel, MD as PCP - Cardiology (Cardiology) Clarice Nottingham, MD (Internal Medicine)  ASSESSMENT & PLAN:  79 y.o. male with history of atherosclerosis, diabetes, erectile dysfunction, insomnia, hepatic steatosis, hyperlipidemia, prostate cancer, B12 deficiency referred to [facility] for macrocytic anemia.  Labs showed borderline high MCV level.  Anemia.  Hemoglobin was 12 in March 2025 and 10.5 in May 2025.  More recently on 12/08/23 hemoglobin 11.8 MCV 100.  Clinically patient is without any concerning symptoms.  We discussed obtaining further testing today.  Will discuss in 2 weeks. Assessment & Plan Macrocytic anemia CBC, CMP, LDH, folate, B12, MMA, copper , free T4 and TSH, INR, ferritin Hepatic steatosis We will obtain CMP and INR Stage 3a chronic kidney disease (HCC) Renal insufficiency, could be reasons for macrocytosis.  Orders Placed This Encounter  Procedures   CBC with Differential (Cancer Center Only)    Standing Status:   Future    Number of Occurrences:   1    Expiration Date:   03/04/2025   CMP (Cancer Center only)    Standing Status:   Future    Number of Occurrences:   1    Expiration Date:   03/04/2025   Lactate dehydrogenase    Standing Status:   Future    Number of Occurrences:   1    Expiration Date:   03/04/2025   Folate    Standing Status:   Future    Number of Occurrences:   1    Expiration Date:   03/04/2025   Vitamin B12    Standing Status:   Future    Number of Occurrences:   1    Expiration Date:   03/04/2025   Methylmalonic acid, serum    Standing Status:   Future    Number of Occurrences:   1    Expiration Date:   04/04/2024   Copper , serum    Standing Status:   Future    Number of Occurrences:   1    Expiration Date:   03/04/2025   Technologist smear review    Clinical information::   Macrocytosis    T4, free    Standing Status:   Future    Number of Occurrences:   1    Expiration Date:   04/04/2024   TSH    Standing Status:   Future    Number of Occurrences:   1    Expiration Date:   04/04/2024   Protime-INR    Standing Status:   Future    Number of Occurrences:   1    Expiration Date:   03/04/2025   Ferritin    Standing Status:   Future    Number of Occurrences:   1    Expiration Date:   03/04/2025    All questions were answered. The patient knows to call the clinic with any problems, questions or concerns.  Pauletta JAYSON Chihuahua, MD 1/20/20263:28 PM   CHIEF COMPLAINTS/PURPOSE OF CONSULTATION:  Anemia  HISTORY OF PRESENTING ILLNESS:  Vincent Kent 79 y.o. male is here because of anemia.  Available outside records reviewed.  Reported history of hepatic steatosis, urinary leaking, hypertension, hyperlipidemia, carotid atherosclerosis, diabetes, sciatica, diverticulosis.  Patient's lab from 2025 showed adequate renal function, borderline elevated BUN.  Normal calcium , total protein, albumin and globulin level.  Normal bilirubin, alkaline phosphatase, AST and ALT.  CBC as below  WBC and platelet levels were normal in December 2024, April 2025 and October 2025. 01/24/2023 hemoglobin 11.6 MCV 100 06/08/23 hemoglobin 11.7 MCV 98 12/08/23 hemoglobin 11.8 MCV 100  Iron study dated 01/25/2022 showed iron saturation of 25%, ferritin 335.  02/2023 creatinine 1.35 calcium  9.5 total protein 6.9 albumin 4.3 normal bilirubin, alkaline phosphatase and LFT.    MEDICAL HISTORY:  Past Medical History:  Diagnosis Date   Carotid stenosis    Cervical disc disease    Cervical radiculopathy    Diabetes mellitus    type 2   Diabetes mellitus without complication (HCC)    Erectile dysfunction    Hepatic steatosis    History of kidney stones    Hyperlipidemia    Hypertension    Pelvic fracture (HCC) 2005   acetabulum fx also   Polyp of colon, adenomatous 10/27/2011   August  2013 - for f/u colonoscopy at 5 yrs   Prostate cancer [facility])    Sciatica 07/17/2011    SURGICAL HISTORY: Past Surgical History:  Procedure Laterality Date   bone spur  2012   cervical bone spur removal   HERNIA REPAIR     INGUINAL HERNIA REPAIR Bilateral 1990   left x 2,right 1   LEFT HEART CATH AND CORONARY ANGIOGRAPHY N/A 06/20/2023   Procedure: LEFT HEART CATH AND CORONARY ANGIOGRAPHY;  Surgeon: Jordan, Peter M, MD;  Location: [facility] INVASIVE CV [facility];  Service: Cardiovascular;  Laterality: N/A;   PROSTATE BIOPSY     RADIOACTIVE SEED IMPLANT N/A 03/19/2018   Procedure: RADIOACTIVE SEED IMPLANT/BRACHYTHERAPY IMPLANT;  Surgeon: Nieves Cough, MD;  Location: [facility];  Service: Urology;  Laterality: N/A;   SPACE OAR INSTILLATION N/A 03/19/2018   Procedure: SPACE OAR INSTILLATION;  Surgeon: Nieves Cough, MD;  Location: [facility];  Service: Urology;  Laterality: N/A;   TONSILLECTOMY  1949    SOCIAL HISTORY: Social History   Socioeconomic History   Marital status: Married    Spouse name: Not on file   Number of children: 2   Years of education: 16   Highest education level: Not on file  Occupational History   Occupation: Education Administrator, ENVIRONMENTAL MANAGER   Occupation: retired  Tobacco Use   Smoking status: Never   Smokeless tobacco: Never  Vaping Use   Vaping status: Never Used  Substance and Sexual Activity   Alcohol use: Yes    Comment: bottle of wine q 2 weeks   Drug use: No   Sexual activity: Not Currently  Other Topics Concern   Not on file  Social History Narrative   ** Merged History Encounter **       Social Drivers of Health   Tobacco Use: Low Risk (07/11/2023)   Patient History    Smoking Tobacco Use: Never    Smokeless Tobacco Use: Never    Passive Exposure: Not on file  Financial Resource Strain: Not on file  Food Insecurity: No Food Insecurity (03/04/2024)   Epic    Worried About Programme Researcher, Broadcasting/film/video in the Last Year: Never true    Ran Out  of Food in the Last Year: Never true  Transportation Needs: No Transportation Needs (03/04/2024)   Epic    Lack of Transportation (Medical): No    Lack of Transportation (Non-Medical): No  Physical Activity: Not on file  Stress: Not on file  Social Connections: Not on file  Intimate Partner Violence: Not on file  Depression (EYV7-0): Not on file  Alcohol  Screen: Not on file  Housing: Low Risk (03/04/2024)   Epic    Unable to Pay for Housing in the Last Year: No    Number of Times Moved in the Last Year: 0    Homeless in the Last Year: No  Utilities: Not At Risk (03/04/2024)   Epic    Threatened with loss of utilities: No  Health Literacy: Not on file    FAMILY HISTORY: Family History  Problem Relation Age of Onset   Heart disease Mother    Stroke Mother    Cancer Mother        type unknown   Dementia Mother    Heart attack Father 38       had 2 more after that   Heart disease Father    Colon cancer Neg Hx     ALLERGIES:  is allergic to flomax  [tamsulosin  hcl] and viagra [sildenafil citrate].  MEDICATIONS:  Current Outpatient Medications  Medication Sig Dispense Refill   alfuzosin  (UROXATRAL ) 10 MG 24 hr tablet Take 1 tablet (10 mg total) by mouth daily with breakfast. 30 tablet 1   aspirin  81 MG tablet Take 81 mg by mouth daily.     cyanocobalamin  (VITAMIN B12) 1000 MCG tablet Take 1,000 mcg by mouth daily.     Dapagliflozin  Pro-metFORMIN  ER (XIGDUO  XR) 06-998 MG TB24 Take 1 tablet by mouth 2 (two) times daily.     Evolocumab  (REPATHA  SURECLICK) 140 MG/ML SOAJ Inject 140 mg into the skin every 14 (fourteen) days. 6 mL 3   GEMTESA 75 MG TABS Take 1 tablet by mouth daily.     glipiZIDE  (GLUCOTROL  XL) 5 MG 24 hr tablet Take 5 mg by mouth daily as needed (blood sugar above 130 in the morning or above 150 at night).     glucose blood (ONE TOUCH ULTRA TEST) test strip Use as directed twice daily as directed.  Diagnosis code 250.02 300 each 3   pantoprazole (PROTONIX) 40 MG  tablet Take 40 mg by mouth daily.     rosuvastatin (CRESTOR) 20 MG tablet Take 20 mg by mouth daily.     traZODone (DESYREL) 100 MG tablet Take 100 mg by mouth at bedtime.     No current facility-administered medications for this visit.    REVIEW OF SYSTEMS:   All relevant systems were reviewed with the patient and are negative.  PHYSICAL EXAMINATION: ECOG PERFORMANCE STATUS: 0  Vitals:   03/04/24 1433  BP: 122/66  Pulse: 76  Resp: 18  Temp: (!) 97.5 F (36.4 C)  SpO2: 100%   Filed Weights   03/04/24 1433  Weight: 146 lb (66.2 kg)    GENERAL: alert, no distress and comfortable SKIN: skin color normal EYES: normal, sclera clear NECK: supple LYMPH:  no palpable cervical, axillary or inguinal lymphadenopathy LUNGS: clear to auscultation with normal breathing effort HEART: regular rate & rhythm and no murmurs and no lower extremity edema ABDOMEN: abdomen soft, non-tender and nondistended  Labs ordered.  Relevant imaging reviewed.

## 2024-03-04 ENCOUNTER — Inpatient Hospital Stay

## 2024-03-04 VITALS — BP 122/66 | HR 76 | Temp 97.5°F | Resp 18 | Ht 70.0 in | Wt 146.0 lb

## 2024-03-04 DIAGNOSIS — Z8546 Personal history of malignant neoplasm of prostate: Secondary | ICD-10-CM

## 2024-03-04 DIAGNOSIS — N1831 Chronic kidney disease, stage 3a: Secondary | ICD-10-CM | POA: Diagnosis not present

## 2024-03-04 DIAGNOSIS — D539 Nutritional anemia, unspecified: Secondary | ICD-10-CM | POA: Insufficient documentation

## 2024-03-04 DIAGNOSIS — N183 Chronic kidney disease, stage 3 unspecified: Secondary | ICD-10-CM | POA: Insufficient documentation

## 2024-03-04 DIAGNOSIS — E1122 Type 2 diabetes mellitus with diabetic chronic kidney disease: Secondary | ICD-10-CM | POA: Insufficient documentation

## 2024-03-04 DIAGNOSIS — K760 Fatty (change of) liver, not elsewhere classified: Secondary | ICD-10-CM

## 2024-03-04 DIAGNOSIS — Z79899 Other long term (current) drug therapy: Secondary | ICD-10-CM | POA: Diagnosis not present

## 2024-03-04 DIAGNOSIS — Z79620 Long term (current) use of immunosuppressive biologic: Secondary | ICD-10-CM | POA: Insufficient documentation

## 2024-03-04 DIAGNOSIS — Z809 Family history of malignant neoplasm, unspecified: Secondary | ICD-10-CM

## 2024-03-04 LAB — CMP (CANCER CENTER ONLY)
ALT: 15 U/L (ref 0–44)
AST: 20 U/L (ref 15–41)
Albumin: 4.6 g/dL (ref 3.5–5.0)
Alkaline Phosphatase: 82 U/L (ref 38–126)
Anion gap: 11 (ref 5–15)
BUN: 25 mg/dL — ABNORMAL HIGH (ref 8–23)
CO2: 26 mmol/L (ref 22–32)
Calcium: 9.5 mg/dL (ref 8.9–10.3)
Chloride: 103 mmol/L (ref 98–111)
Creatinine: 1.2 mg/dL (ref 0.61–1.24)
GFR, Estimated: >60 mL/min
Glucose, Bld: 181 mg/dL — ABNORMAL HIGH (ref 70–99)
Potassium: 4.2 mmol/L (ref 3.5–5.1)
Sodium: 140 mmol/L (ref 135–145)
Total Bilirubin: 0.3 mg/dL (ref 0.0–1.2)
Total Protein: 7.7 g/dL (ref 6.5–8.1)

## 2024-03-04 LAB — TECHNOLOGIST SMEAR REVIEW: Plt Morphology: NORMAL

## 2024-03-04 LAB — CBC WITH DIFFERENTIAL (CANCER CENTER ONLY)
Abs Immature Granulocytes: 0.02 K/uL (ref 0.00–0.07)
Basophils Absolute: 0 K/uL (ref 0.0–0.1)
Basophils Relative: 1 %
Eosinophils Absolute: 0.2 K/uL (ref 0.0–0.5)
Eosinophils Relative: 3 %
HCT: 33.4 % — ABNORMAL LOW (ref 39.0–52.0)
Hemoglobin: 11.6 g/dL — ABNORMAL LOW (ref 13.0–17.0)
Immature Granulocytes: 0 %
Lymphocytes Relative: 37 %
Lymphs Abs: 2.4 K/uL (ref 0.7–4.0)
MCH: 33.3 pg (ref 26.0–34.0)
MCHC: 34.7 g/dL (ref 30.0–36.0)
MCV: 96 fL (ref 80.0–100.0)
Monocytes Absolute: 0.4 K/uL (ref 0.1–1.0)
Monocytes Relative: 6 %
Neutro Abs: 3.5 K/uL (ref 1.7–7.7)
Neutrophils Relative %: 53 %
Platelet Count: 172 K/uL (ref 150–400)
RBC: 3.48 MIL/uL — ABNORMAL LOW (ref 4.22–5.81)
RDW: 12.6 % (ref 11.5–15.5)
WBC Count: 6.4 K/uL (ref 4.0–10.5)
nRBC: 0 % (ref 0.0–0.2)

## 2024-03-04 LAB — VITAMIN B12: Vitamin B-12: 541 pg/mL (ref 180–914)

## 2024-03-04 LAB — FOLATE: Folate: >20 ng/mL

## 2024-03-04 LAB — LACTATE DEHYDROGENASE: LDH: 163 U/L (ref 105–235)

## 2024-03-04 LAB — PROTIME-INR
INR: 0.9 (ref 0.8–1.2)
Prothrombin Time: 12.4 s (ref 11.4–15.2)

## 2024-03-04 LAB — T4, FREE: Free T4: 1.31 ng/dL (ref 0.80–2.00)

## 2024-03-04 LAB — FERRITIN: Ferritin: 218 ng/mL (ref 24–336)

## 2024-03-04 LAB — TSH: TSH: 1.29 u[IU]/mL (ref 0.350–4.500)

## 2024-03-04 NOTE — Assessment & Plan Note (Signed)
 Renal insufficiency, could be reasons for macrocytosis.

## 2024-03-05 ENCOUNTER — Inpatient Hospital Stay

## 2024-03-05 ENCOUNTER — Inpatient Hospital Stay: Admitting: Physician Assistant

## 2024-03-06 LAB — METHYLMALONIC ACID, SERUM: Methylmalonic Acid, Quantitative: 250 nmol/L (ref 0–378)

## 2024-03-06 LAB — COPPER, SERUM: Copper: 87 ug/dL (ref 66–121)

## 2024-03-17 NOTE — Progress Notes (Unsigned)
 [facility] OFFICE PROGRESS NOTE  Patient Care Team: Clarice Nottingham, MD as PCP - General (Internal Medicine) Francyne Headland, MD as PCP - Cardiology (Cardiology) Clarice Nottingham, MD (Internal Medicine)  Telephone visit requested per patient. Physician location: [facility] Patient location: Home Total time for the encounter:  79 y.o. male with history of atherosclerosis, diabetes, erectile dysfunction, insomnia, hepatic steatosis, hyperlipidemia, prostate cancer, B12 deficiency referred to [facility] Hematology and Oncology for macrocytic anemia.   Labs showed borderline high MCV level.  Anemia.  Hemoglobin was 12 in March 2025 and 10.5 in May 2025.  More recently on 12/08/23 hemoglobin 11.8 MCV 100.   Clinically patient is without any concerning symptoms.   Assessment & Plan   No orders of the defined types were placed in this encounter.    Pauletta JAYSON Chihuahua, MD  INTERVAL HISTORY: Patient returns for follow-up.  Oncology History   No problem history exists.     PHYSICAL EXAMINATION: ECOG PERFORMANCE STATUS: {CHL ONC ECOG [phone]}  There were no vitals filed for this visit. There were no vitals filed for this visit.  Relevant data reviewed during this visit included ***

## 2024-03-18 ENCOUNTER — Inpatient Hospital Stay

## 2024-03-18 DIAGNOSIS — D539 Nutritional anemia, unspecified: Secondary | ICD-10-CM

## 2024-09-18 ENCOUNTER — Inpatient Hospital Stay
